# Patient Record
Sex: Female | Born: 1965 | Race: Black or African American | Hispanic: No | Marital: Single | Smoking: Current every day smoker
Health system: Southern US, Community
[De-identification: ages and names within clinical notes are randomized; demographics above are authoritative.]

## PROBLEM LIST (undated history)

## (undated) ENCOUNTER — Emergency Department (HOSPITAL_COMMUNITY): Admission: EM | Payer: Medicaid Other | Source: Home / Self Care

## (undated) DIAGNOSIS — E119 Type 2 diabetes mellitus without complications: Secondary | ICD-10-CM

## (undated) DIAGNOSIS — R479 Unspecified speech disturbances: Secondary | ICD-10-CM

## (undated) DIAGNOSIS — I10 Essential (primary) hypertension: Secondary | ICD-10-CM

## (undated) HISTORY — DX: Essential (primary) hypertension: I10

## (undated) HISTORY — PX: ABDOMINAL HYSTERECTOMY: SHX81

## (undated) HISTORY — PX: MULTIPLE TOOTH EXTRACTIONS: SHX2053

---

## 1997-12-19 ENCOUNTER — Inpatient Hospital Stay (HOSPITAL_COMMUNITY)
Admission: RE | Admit: 1997-12-19 | Discharge: 1998-01-03 | Payer: Self-pay | Admitting: Physical Medicine and Rehabilitation

## 1997-12-23 ENCOUNTER — Encounter: Payer: Self-pay | Admitting: Physical Medicine and Rehabilitation

## 2013-01-30 ENCOUNTER — Ambulatory Visit: Payer: Self-pay | Admitting: Podiatrist

## 2013-02-08 ENCOUNTER — Ambulatory Visit (INDEPENDENT_AMBULATORY_CARE_PROVIDER_SITE_OTHER): Payer: Medicaid Other

## 2013-02-08 ENCOUNTER — Ambulatory Visit: Payer: Self-pay

## 2013-02-08 ENCOUNTER — Encounter (INDEPENDENT_AMBULATORY_CARE_PROVIDER_SITE_OTHER): Payer: Self-pay

## 2013-02-08 VITALS — BP 166/95 | HR 63 | Resp 24 | Ht 62.0 in | Wt 160.0 lb

## 2013-02-08 DIAGNOSIS — L84 Corns and callosities: Secondary | ICD-10-CM

## 2013-02-08 DIAGNOSIS — Q828 Other specified congenital malformations of skin: Secondary | ICD-10-CM

## 2013-02-08 DIAGNOSIS — M79609 Pain in unspecified limb: Secondary | ICD-10-CM

## 2013-02-08 DIAGNOSIS — M2040 Other hammer toe(s) (acquired), unspecified foot: Secondary | ICD-10-CM

## 2013-02-08 DIAGNOSIS — B070 Plantar wart: Secondary | ICD-10-CM

## 2013-02-08 NOTE — Progress Notes (Signed)
  Subjective:    Patient ID: Carla [name], female    DOB: June 04, 1965, 47 y.o.   MRN: [id number] "I got a callus problem."  Carla [name] This is a new problem. The current episode started more than 1 year ago. The problem occurs constantly. The problem has been gradually worsening. Associated symptoms include coughing and headaches. The symptoms are aggravated by walking and standing (shoes, barefoot in house a little). Treatments tried: soak in epsom salt water. The treatment provided mild relief.      Review of Systems  Constitutional: Negative.   HENT: Positive for sinus pressure.   Eyes: Positive for itching.  Respiratory: Positive for cough and shortness of breath.   Cardiovascular: Negative.   Gastrointestinal: Positive for constipation.  Endocrine: Positive for polydipsia.  Genitourinary: Negative.   Musculoskeletal: Positive for Carla [name].  Skin: Negative.   Allergic/Immunologic: Negative.   Neurological: Positive for speech difficulty and headaches.  Hematological:       Slow to heal  Psychiatric/Behavioral: Positive for confusion and decreased concentration.       Objective:   Physical Exam Vascular status is intact with pedal pulses palpable DP and PT + over 4 bilateral. Capillary fill time 3 seconds all digits. Skin temperature warm. Neurologically epicritic and proprioceptive sensations intact and symmetric bilateral. Orthopedic biomechanical exam rectus Carla type noted bilateral adductovarus rotation lesser digits 3 and 45. X-rays reveal rectus Carla type no signs of fracture or osseous abnormalities noted no cyst or tumors bilateral x-rays. Dermatologically skin color pigment normal. Hair growth absent bilateral nails slightly thick and hypertrophic pigmented and incurvated. There is also multiple skin lesions nucleated keratotic lesions sub-first right sub-fifth left extremely painful and tender on debridement and on palpation also keratoses pinch callus in of first left  IPJ and MTP also multiple keratoses inferior heel and arch bilateral. Lesions pretty well nucleated with interruption of skin line consistent with verruca versus porokeratosis. No open wounds or ulcerations are identified at this time    Assessment & Plan:  Assessment this time hammertoe deformities orthopedic exam otherwise unremarkable. No osseous confusion to the keratoses name Carla [name] likely flexible digital contractures verruca plantaris versus porokeratosis keratotic lesions are debrided dispensed literature in instructions for topical salicylic acid application under occlusion utilizing.tape and self-care by patient. Followup in the future on an as-needed basis for additional treatment if recurrence or persistent lesions. Also recommended thick soled shoes cotton or Kerlix socks at all times. Recommended a pumice stone and self palliative care on a daily basis. Next  Alvan Dame DPM

## 2013-02-08 NOTE — Patient Instructions (Signed)
Warts Warts are a common viral infection. They are most commonly caused by the human papillomavirus (HPV). Warts can occur at all ages. However, they occur most frequently in older children and infrequently in the elderly. Warts may be single or multiple. Location and size varies. Warts can be spread by scratching the wart and then scratching normal skin. The life cycle of warts varies. However, most will disappear over many months to a couple years. Warts commonly do not cause problems (asymptomatic) unless they are over an area of pressure, such as the bottom of the foot. If they are large enough, they may cause pain with walking. DIAGNOSIS  Warts are most commonly diagnosed by their appearance. Tissue samples (biopsies) are not required unless the wart looks abnormal. Most warts have a rough surface, are round, oval, or irregular, and are skin-colored to light yellow, brown, or gray. They are generally less than  inch (1.3 cm), but they can be any size. TREATMENT   Observation or no treatment.  Freezing with liquid nitrogen.  High heat (cautery).  Boosting the body's immunity to fight off the wart (immunotherapy using Candida antigen).  Laser surgery.  Application of various irritants and solutions. HOME CARE INSTRUCTIONS  Follow your caregiver's instructions. No special precautions are necessary. Often, treatment may be followed by a return (recurrence) of warts. Warts are generally difficult to treat and get rid of. If treatment is done in a clinic setting, usually more than 1 treatment is required. This is usually done on only a monthly basis until the wart is completely gone. SEEK IMMEDIATE MEDICAL CARE IF: The treated skin becomes red, puffy (swollen), or painful. Document Released: 12/23/2004 Document Revised: 07/10/2012 Document Reviewed: 06/20/2009 [employer] Patient Information 2014 [employer].  

## 2017-05-09 ENCOUNTER — Other Ambulatory Visit: Payer: Self-pay

## 2017-05-09 ENCOUNTER — Emergency Department (HOSPITAL_COMMUNITY): Payer: Medicaid Other

## 2017-05-09 ENCOUNTER — Encounter (HOSPITAL_COMMUNITY): Payer: Self-pay

## 2017-05-09 ENCOUNTER — Inpatient Hospital Stay (HOSPITAL_COMMUNITY)
Admission: EM | Admit: 2017-05-09 | Discharge: 2017-05-11 | DRG: 872 | Disposition: A | Discharge status: Home / Self Care | Payer: Medicaid Other | Attending: Internal Medicine | Admitting: Internal Medicine

## 2017-05-09 DIAGNOSIS — J45901 Unspecified asthma with (acute) exacerbation: Secondary | ICD-10-CM | POA: Diagnosis present

## 2017-05-09 DIAGNOSIS — T380X5A Adverse effect of glucocorticoids and synthetic analogues, initial encounter: Secondary | ICD-10-CM | POA: Diagnosis not present

## 2017-05-09 DIAGNOSIS — I10 Essential (primary) hypertension: Secondary | ICD-10-CM | POA: Diagnosis present

## 2017-05-09 DIAGNOSIS — J441 Chronic obstructive pulmonary disease with (acute) exacerbation: Secondary | ICD-10-CM

## 2017-05-09 DIAGNOSIS — Z79899 Other long term (current) drug therapy: Secondary | ICD-10-CM | POA: Diagnosis not present

## 2017-05-09 DIAGNOSIS — E1165 Type 2 diabetes mellitus with hyperglycemia: Secondary | ICD-10-CM | POA: Diagnosis not present

## 2017-05-09 DIAGNOSIS — F17210 Nicotine dependence, cigarettes, uncomplicated: Secondary | ICD-10-CM | POA: Diagnosis present

## 2017-05-09 DIAGNOSIS — A419 Sepsis, unspecified organism: Secondary | ICD-10-CM

## 2017-05-09 DIAGNOSIS — Z9981 Dependence on supplemental oxygen: Secondary | ICD-10-CM

## 2017-05-09 DIAGNOSIS — J101 Influenza due to other identified influenza virus with other respiratory manifestations: Secondary | ICD-10-CM | POA: Diagnosis present

## 2017-05-09 DIAGNOSIS — E119 Type 2 diabetes mellitus without complications: Secondary | ICD-10-CM

## 2017-05-09 DIAGNOSIS — Z7951 Long term (current) use of inhaled steroids: Secondary | ICD-10-CM

## 2017-05-09 DIAGNOSIS — Z7984 Long term (current) use of oral hypoglycemic drugs: Secondary | ICD-10-CM

## 2017-05-09 HISTORY — DX: Unspecified asthma with (acute) exacerbation: J45.901

## 2017-05-09 HISTORY — DX: Chronic obstructive pulmonary disease with (acute) exacerbation: J44.1

## 2017-05-09 HISTORY — DX: Sepsis, unspecified organism: A41.9

## 2017-05-09 LAB — BASIC METABOLIC PANEL
Anion gap: 14 (ref 5–15)
BUN: 10 mg/dL (ref 6–20)
CHLORIDE: 98 mmol/L — AB (ref 101–111)
CO2: 22 mmol/L (ref 22–32)
Calcium: 9.1 mg/dL (ref 8.9–10.3)
Creatinine, Ser: 0.99 mg/dL (ref 0.44–1.00)
GFR calc Af Amer: >60 mL/min (ref >60)
GFR calc non Af Amer: >60 mL/min (ref >60)
Glucose, Bld: 395 mg/dL — ABNORMAL HIGH (ref 65–99)
POTASSIUM: 4.3 mmol/L (ref 3.5–5.1)
SODIUM: 134 mmol/L — AB (ref 135–145)

## 2017-05-09 LAB — I-STAT CG4 LACTIC ACID, ED
LACTIC ACID, VENOUS: 2.89 mmol/L — AB (ref 0.5–1.9)
LACTIC ACID, VENOUS: 1.14 mmol/L (ref 0.5–1.9)

## 2017-05-09 LAB — CBC
HEMATOCRIT: 39.5 % (ref 36.0–46.0)
HEMOGLOBIN: 13 g/dL (ref 12.0–15.0)
MCH: 32.3 pg (ref 26.0–34.0)
MCHC: 32.9 g/dL (ref 30.0–36.0)
MCV: 98 fL (ref 78.0–100.0)
Platelets: 164 10*3/uL (ref 150–400)
RBC: 4.03 MIL/uL (ref 3.87–5.11)
RDW: 13.4 % (ref 11.5–15.5)
WBC: 9 10*3/uL (ref 4.0–10.5)

## 2017-05-09 LAB — INFLUENZA PANEL BY PCR (TYPE A & B)
INFLAPCR: POSITIVE — AB
INFLBPCR: NEGATIVE

## 2017-05-09 LAB — GLUCOSE, CAPILLARY: GLUCOSE-CAPILLARY: 265 mg/dL — AB (ref 65–99)

## 2017-05-09 LAB — PROCALCITONIN: PROCALCITONIN: 0.18 ng/mL

## 2017-05-09 LAB — I-STAT TROPONIN, ED
Troponin i, poc: 0 ng/mL (ref 0.00–0.08)
Troponin i, poc: 0 ng/mL (ref 0.00–0.08)

## 2017-05-09 LAB — CBG MONITORING, ED: Glucose-Capillary: 191 mg/dL — ABNORMAL HIGH (ref 65–99)

## 2017-05-09 IMAGING — DX DG CHEST 2V
2 series · 2 of 2 positions shown · non-contrast
Comparison: CT chest [DATE]

CLINICAL DATA: Chest pain started yesterday.  Cough.

EXAM:
CHEST  2 VIEW

[w chest pa]
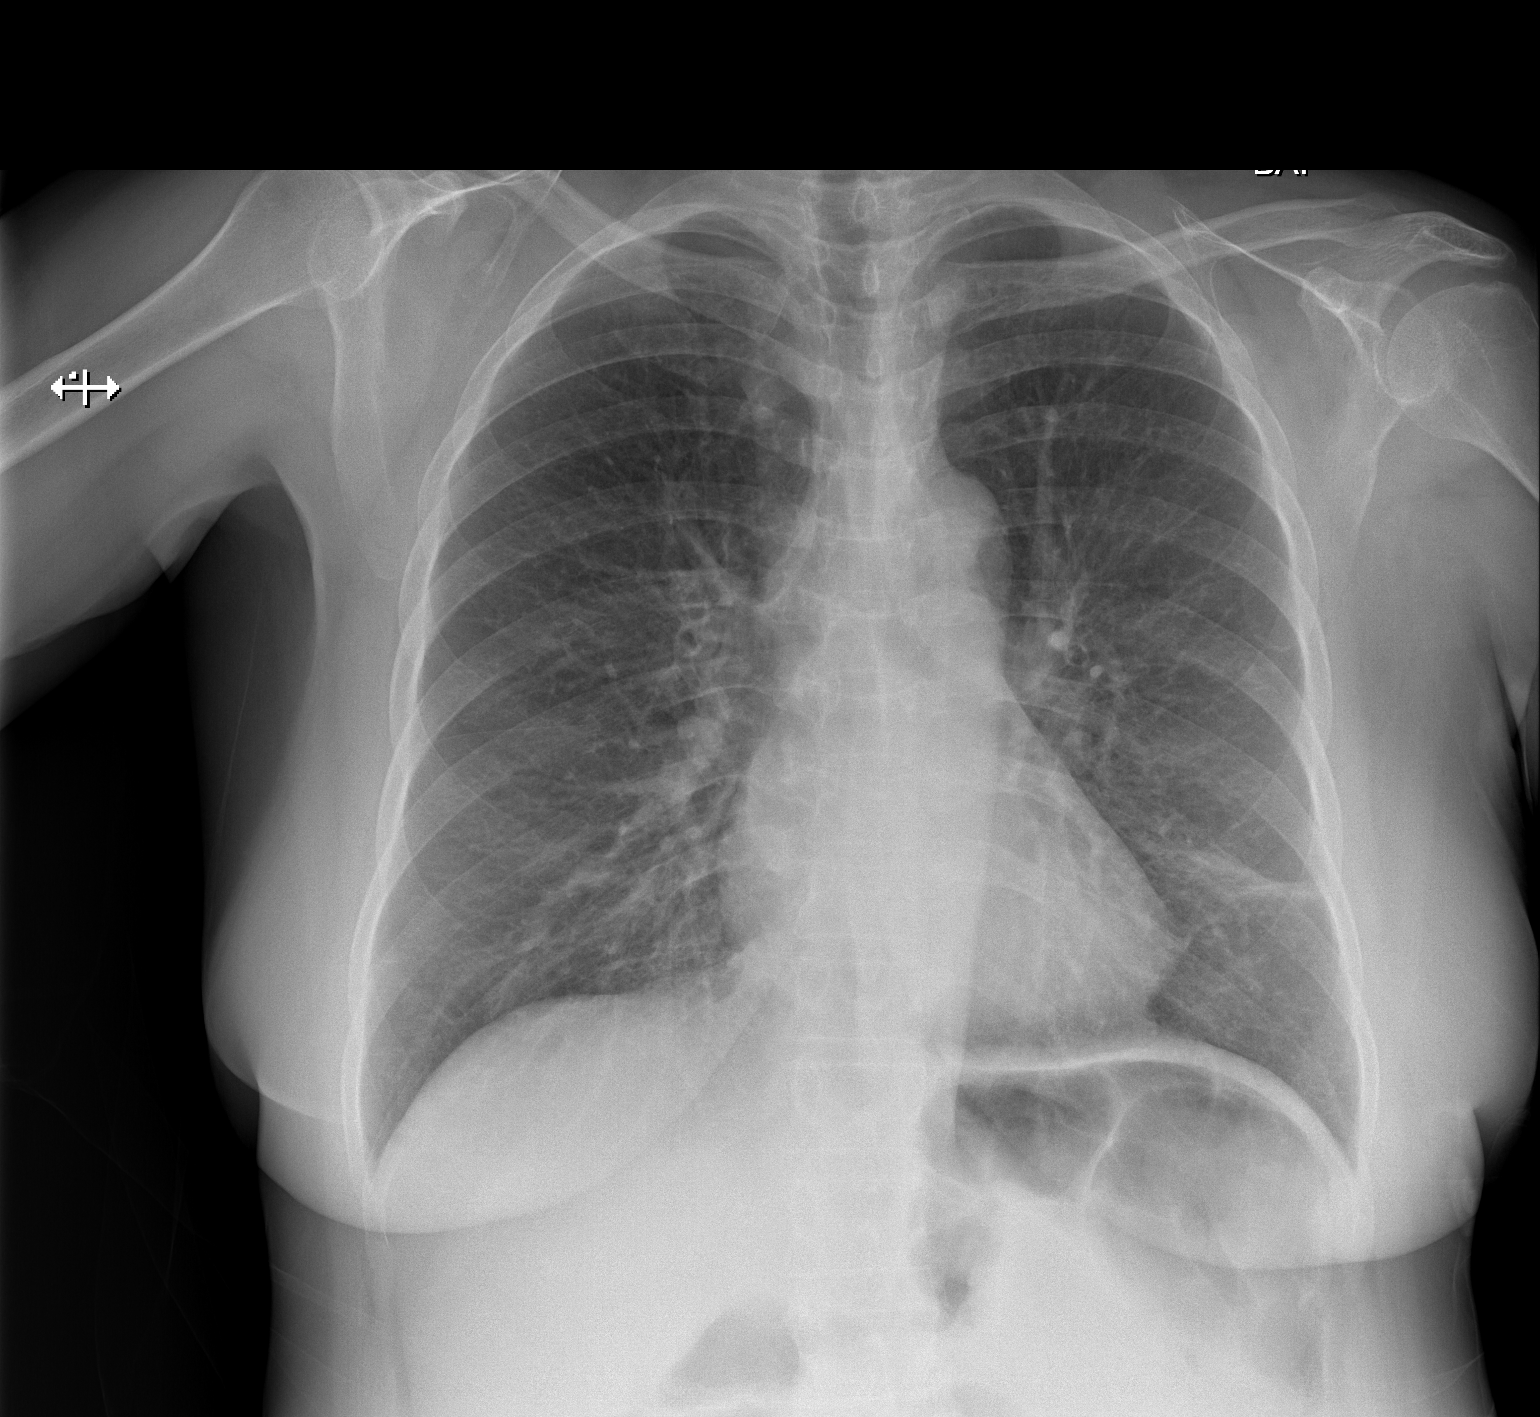

[w chest lat]
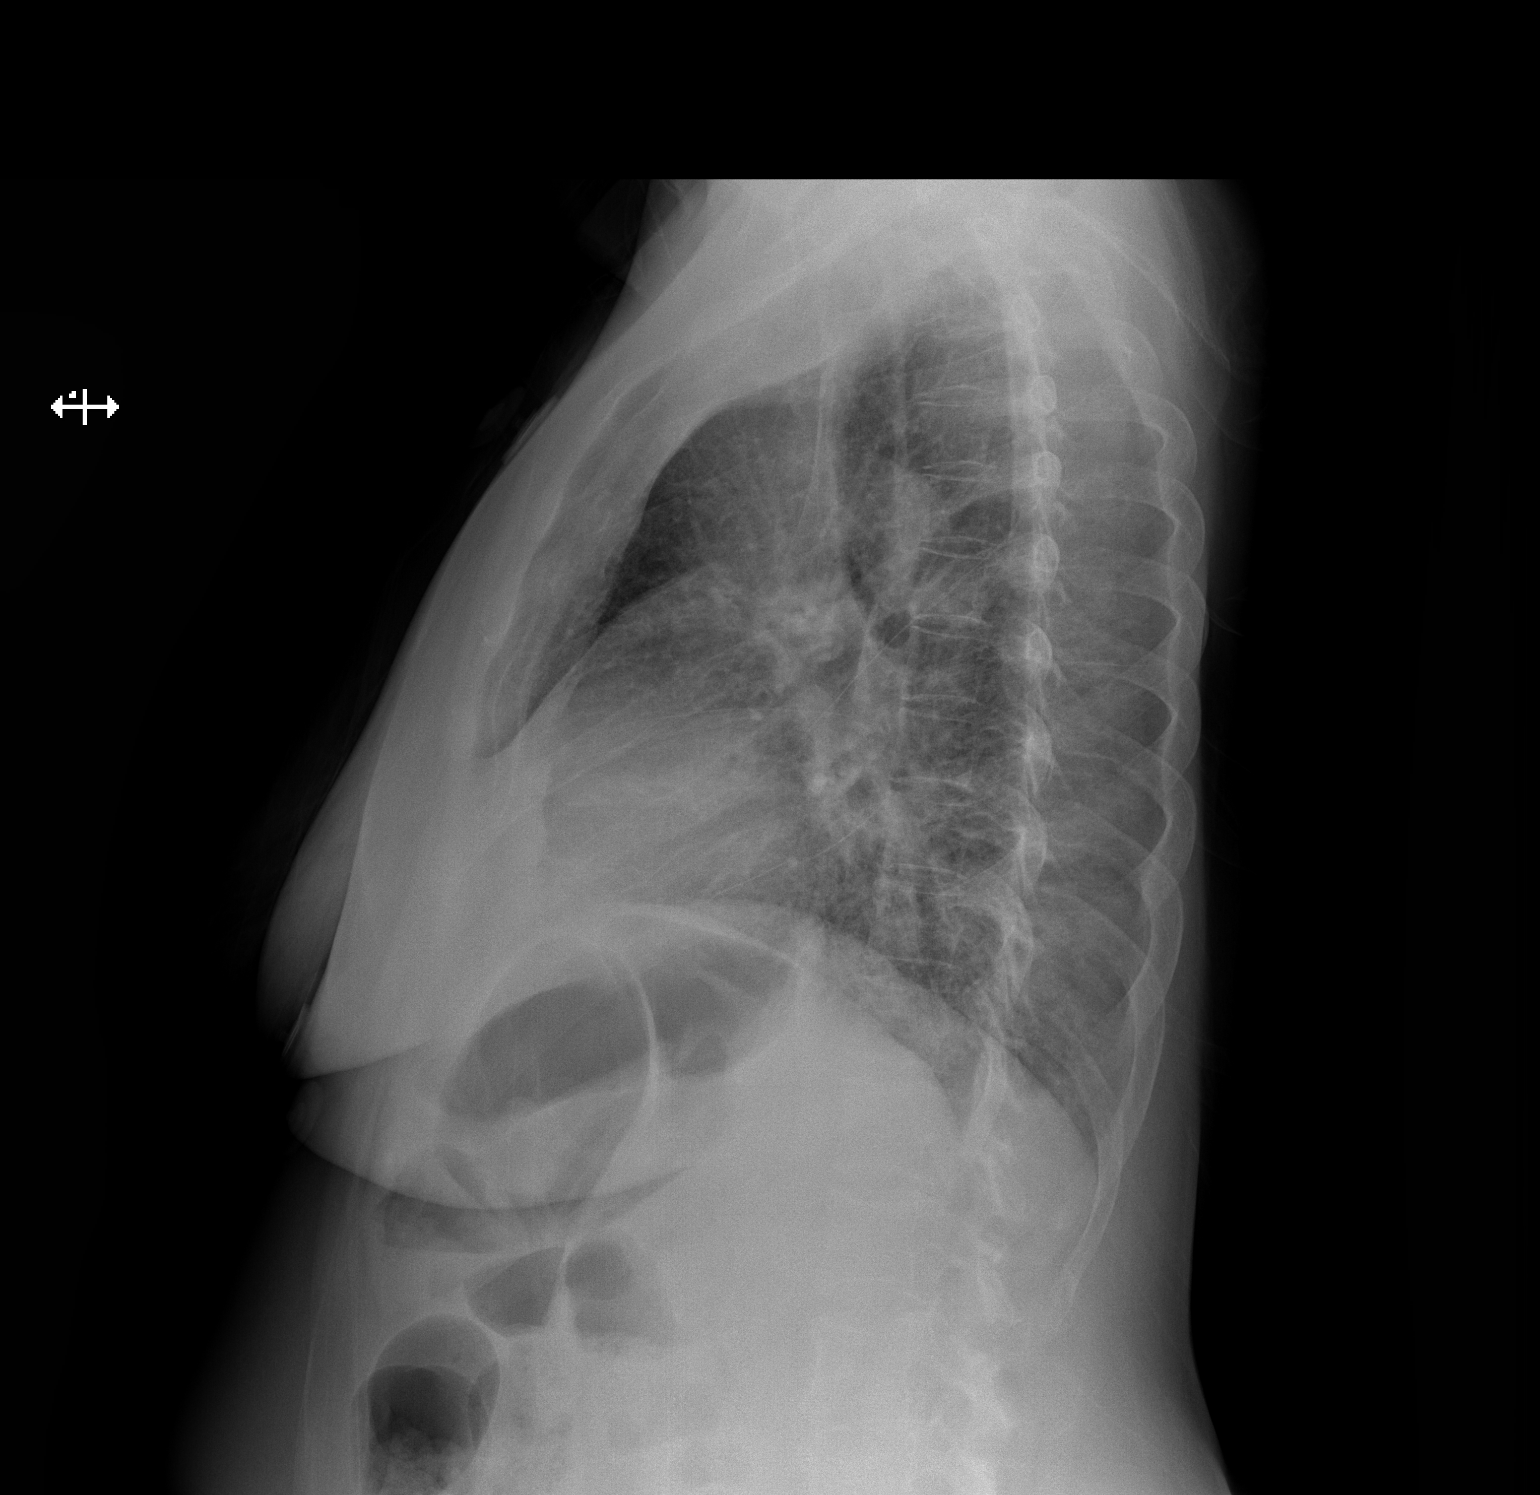

[2 of 2 positions shown; findings below may reference images not displayed]

FINDINGS: There is mild lingular atelectasis. There is no focal consolidation.
There is no pleural effusion or pneumothorax. The heart and
mediastinal contours are unremarkable.

The osseous structures are unremarkable.
IMPRESSION: No active cardiopulmonary disease.

## 2017-05-09 MED ORDER — SODIUM CHLORIDE 0.9 % IV SOLN
INTRAVENOUS | Status: AC
  Administered 2017-05-09: 23:00:00 via INTRAVENOUS

## 2017-05-09 MED ORDER — IBUPROFEN 400 MG PO TABS
600.0000 mg | ORAL_TABLET | Freq: Once | ORAL | Status: AC
  Administered 2017-05-09: 600 mg via ORAL
  Filled 2017-05-09: qty 1

## 2017-05-09 MED ORDER — PAROXETINE HCL 10 MG PO TABS
10.0000 mg | ORAL_TABLET | Freq: Every day | ORAL | Status: DC
  Administered 2017-05-10 – 2017-05-11 (×2): 10 mg via ORAL
  Filled 2017-05-09 (×2): qty 1

## 2017-05-09 MED ORDER — FAMOTIDINE 10 MG PO TABS
10.0000 mg | ORAL_TABLET | Freq: Two times a day (BID) | ORAL | Status: DC
  Administered 2017-05-10 – 2017-05-11 (×3): 10 mg via ORAL
  Filled 2017-05-09 (×3): qty 1

## 2017-05-09 MED ORDER — ACETAMINOPHEN 650 MG RE SUPP: 650.0000 mg | Freq: Four times a day (QID) | RECTAL | Status: DC | PRN

## 2017-05-09 MED ORDER — IPRATROPIUM-ALBUTEROL 0.5-2.5 (3) MG/3ML IN SOLN
3.0000 mL | Freq: Once | RESPIRATORY_TRACT | Status: AC
  Administered 2017-05-09: 3 mL via RESPIRATORY_TRACT
  Filled 2017-05-09: qty 3

## 2017-05-09 MED ORDER — HYDROCODONE-ACETAMINOPHEN 5-325 MG PO TABS
1.0000 | ORAL_TABLET | ORAL | Status: DC | PRN
  Administered 2017-05-10: 2 via ORAL
  Administered 2017-05-11: 1 via ORAL
  Filled 2017-05-09: qty 2
  Filled 2017-05-09: qty 1

## 2017-05-09 MED ORDER — NICOTINE 14 MG/24HR TD PT24
14.0000 mg | MEDICATED_PATCH | Freq: Every day | TRANSDERMAL | Status: DC
  Administered 2017-05-10 – 2017-05-11 (×2): 14 mg via TRANSDERMAL
  Filled 2017-05-09 (×2): qty 1

## 2017-05-09 MED ORDER — INSULIN ASPART 100 UNIT/ML ~~LOC~~ SOLN
0.0000 [IU] | Freq: Three times a day (TID) | SUBCUTANEOUS | Status: DC
  Administered 2017-05-10: 8 [IU] via SUBCUTANEOUS
  Administered 2017-05-10: 11 [IU] via SUBCUTANEOUS
  Administered 2017-05-10: 8 [IU] via SUBCUTANEOUS
  Administered 2017-05-11: 15 [IU] via SUBCUTANEOUS
  Administered 2017-05-11: 5 [IU] via SUBCUTANEOUS

## 2017-05-09 MED ORDER — ENOXAPARIN SODIUM 40 MG/0.4ML ~~LOC~~ SOLN
40.0000 mg | SUBCUTANEOUS | Status: DC
  Administered 2017-05-10: 40 mg via SUBCUTANEOUS
  Filled 2017-05-09 (×2): qty 0.4

## 2017-05-09 MED ORDER — ALBUTEROL SULFATE (2.5 MG/3ML) 0.083% IN NEBU: 2.5000 mg | INHALATION_SOLUTION | RESPIRATORY_TRACT | Status: DC | PRN

## 2017-05-09 MED ORDER — IPRATROPIUM-ALBUTEROL 0.5-2.5 (3) MG/3ML IN SOLN
3.0000 mL | Freq: Four times a day (QID) | RESPIRATORY_TRACT | Status: DC
  Administered 2017-05-10: 3 mL via RESPIRATORY_TRACT
  Filled 2017-05-09: qty 3

## 2017-05-09 MED ORDER — SODIUM CHLORIDE 0.9 % IV SOLN: 1.0000 g | INTRAVENOUS | Status: DC

## 2017-05-09 MED ORDER — ONDANSETRON HCL 4 MG/2ML IJ SOLN: 4.0000 mg | Freq: Four times a day (QID) | INTRAMUSCULAR | Status: DC | PRN

## 2017-05-09 MED ORDER — SODIUM CHLORIDE 0.9 % IV SOLN: 500.0000 mg | INTRAVENOUS | Status: DC

## 2017-05-09 MED ORDER — ACETAMINOPHEN 325 MG PO TABS: 650.0000 mg | ORAL_TABLET | Freq: Four times a day (QID) | ORAL | Status: DC | PRN

## 2017-05-09 MED ORDER — INSULIN ASPART 100 UNIT/ML ~~LOC~~ SOLN
0.0000 [IU] | Freq: Every day | SUBCUTANEOUS | Status: DC
  Administered 2017-05-09: 3 [IU] via SUBCUTANEOUS
  Administered 2017-05-10: 4 [IU] via SUBCUTANEOUS

## 2017-05-09 MED ORDER — ACETAMINOPHEN 325 MG PO TABS
650.0000 mg | ORAL_TABLET | Freq: Once | ORAL | Status: AC
  Administered 2017-05-09: 650 mg via ORAL
  Filled 2017-05-09: qty 2

## 2017-05-09 MED ORDER — PREDNISONE 20 MG PO TABS: 40.0000 mg | ORAL_TABLET | Freq: Every day | ORAL | Status: DC

## 2017-05-09 MED ORDER — METHYLPREDNISOLONE SODIUM SUCC 125 MG IJ SOLR
60.0000 mg | Freq: Two times a day (BID) | INTRAMUSCULAR | Status: DC
  Administered 2017-05-09: 60 mg via INTRAVENOUS
  Filled 2017-05-09: qty 2

## 2017-05-09 MED ORDER — ONDANSETRON HCL 4 MG PO TABS: 4.0000 mg | ORAL_TABLET | Freq: Four times a day (QID) | ORAL | Status: DC | PRN

## 2017-05-09 MED ORDER — SODIUM CHLORIDE 0.9 % IV BOLUS (SEPSIS)
2000.0000 mL | Freq: Once | INTRAVENOUS | Status: AC
  Administered 2017-05-09: 2000 mL via INTRAVENOUS

## 2017-05-09 MED ORDER — PANTOPRAZOLE SODIUM 40 MG PO TBEC
40.0000 mg | DELAYED_RELEASE_TABLET | Freq: Every day | ORAL | Status: DC
  Administered 2017-05-10 – 2017-05-11 (×2): 40 mg via ORAL
  Filled 2017-05-09 (×2): qty 1

## 2017-05-09 MED ORDER — CEFTRIAXONE SODIUM 1 G IJ SOLR
1.0000 g | Freq: Once | INTRAMUSCULAR | Status: AC
  Administered 2017-05-09: 1 g via INTRAVENOUS
  Filled 2017-05-09: qty 10

## 2017-05-09 MED ORDER — METHYLPREDNISOLONE SODIUM SUCC 125 MG IJ SOLR
125.0000 mg | Freq: Once | INTRAMUSCULAR | Status: AC
  Administered 2017-05-09: 125 mg via INTRAVENOUS
  Filled 2017-05-09: qty 2

## 2017-05-09 MED ORDER — GUAIFENESIN ER 600 MG PO TB12
600.0000 mg | ORAL_TABLET | Freq: Two times a day (BID) | ORAL | Status: DC
  Administered 2017-05-09 – 2017-05-11 (×4): 600 mg via ORAL
  Filled 2017-05-09 (×4): qty 1

## 2017-05-09 MED ORDER — SODIUM CHLORIDE 0.9 % IV SOLN
500.0000 mg | Freq: Once | INTRAVENOUS | Status: AC
  Administered 2017-05-10: 500 mg via INTRAVENOUS
  Filled 2017-05-09: qty 500

## 2017-05-09 NOTE — ED Notes (Signed)
Ambulated pt in hallway while checking O2, tolerated well. Pt's O2 started at 96% while ambulating it dropped to 91%. Nurse was notified.

## 2017-05-09 NOTE — H&P (Signed)
Carla [name] EXB:[id number] DOB: 04/27/1965 DOA: 05/09/2017     NUU:[facility] Outpatient Specialists:none Patient coming from:    home Lives alone,      Chief Complaint: shortness of breath  HPI: Carla [name] is a 52 y.o. female with medical history significant of asthma, DM 2, HTN, tobacco abuse    Presented with 3 days of shortness of breath, overall feeling poorly had having some headaches and coughing productive of greenish sputum for the past 3 days no hemoptysis but she have had increased wheezing and shortness of breath Developed chest pain today mid sternum while she was cleaning her house initially pain was severe 9.5 out of 10 only happens with coughing does not change with inspiration physician or exertion nonradiating. She has known history of asthma for which she's been using her inhalers  denies fever, denies sick contacts.   While in emergency department oxygen saturation went down to 91% while ambulating with increased work of breathing. Noted to be Febbrile up to 102.7 Initial lactic acid 2.8 9 repeat was 1.1 for initial troponin unremarkable 0.00 Sodium 134 glucose 395 WBC 9.0 Chest x-ray was unremarkable  Regarding pertinent Chronic problems: History of diabetes on metformin and glipizide, history of hypertension on amlodipine   IN ER:  Temp (24hrs), Avg:100.5 F (38.1 C), Min:98.8 F (37.1 C), Max:102.7 F (39.3 C)      on arrival  ED Triage Vitals  Enc Vitals Group     BP 05/09/17 1317 123/73     Pulse Rate 05/09/17 1317 100     Resp 05/09/17 1317 20     Temp 05/09/17 1317 (!) 102.7 F (39.3 C)     Temp Source 05/09/17 1317 Oral     SpO2 05/09/17 1317 96 %     Weight --      Height --      Head Circumference --      Peak Flow --      Pain Score 05/09/17 1322 9     Pain Loc --      Pain Edu? --      Excl. in GC? --     Latest RR 19 satting 98% HR 84 BP 119/95  Following Medications were ordered in ER: Medications  acetaminophen  (TYLENOL) tablet 650 mg (650 mg Oral Given 05/09/17 1329)  sodium chloride 0.9 % bolus 2,000 mL (0 mLs Intravenous Stopped 05/09/17 1757)  ibuprofen (ADVIL,MOTRIN) tablet 600 mg (600 mg Oral Given 05/09/17 1709)  ipratropium-albuterol (DUONEB) 0.5-2.5 (3) MG/3ML nebulizer solution 3 mL (3 mLs Nebulization Given 05/09/17 1709)  methylPREDNISolone sodium succinate (SOLU-MEDROL) 125 mg/2 mL injection 125 mg (125 mg Intravenous Given 05/09/17 1708)  ipratropium-albuterol (DUONEB) 0.5-2.5 (3) MG/3ML nebulizer solution 3 mL (3 mLs Nebulization Given 05/09/17 1802)    Hospitalist was called for admission for likely asthma exacerbation in a setting of URI or viral illness  Review of Systems:    Pertinent positives include:  Fevers, chills, fatigue,  dyspnea on exertion  Constitutional:  No weight loss, night sweats,  weight loss  HEENT:  No headaches, Difficulty swallowing,Tooth/dental problems,Sore throat,  No sneezing, itching, ear ache, nasal congestion, post nasal drip,  Cardio-vascular:  No chest pain, Orthopnea, PND, anasarca, dizziness, palpitations.no Bilateral lower extremity swelling  GI:  No heartburn, indigestion, abdominal pain, nausea, vomiting, diarrhea, change in bowel habits, loss of appetite, melena, blood in stool, hematemesis Resp:  no shortness of breath at rest. No , No excess mucus, no productive cough, No non-productive  cough, No coughing up of blood.No change in color of mucus.No wheezing. Skin:  no rash or lesions. No jaundice GU:  no dysuria, change in color of urine, no urgency or frequency. No straining to urinate.  No flank pain.  Musculoskeletal:  No joint pain or no joint swelling. No decreased range of motion. No back pain.  Psych:  No change in mood or affect. No depression or anxiety. No memory loss.  Neuro: no localizing neurological complaints, no tingling, no weakness, no double vision, no gait abnormality, no slurred speech, no confusion  As per HPI  otherwise 10 point review of systems negative.   Past Medical History: Past Medical History:  Diagnosis Date  . Hypertension    Past Surgical History:  Procedure Laterality Date  . ABDOMINAL HYSTERECTOMY    . CESAREAN SECTION    . MULTIPLE TOOTH EXTRACTIONS Bilateral      Social History:  Ambulatory independently      reports that she has been smoking cigarettes.  She has been smoking about 0.50 packs per day. She does not have any smokeless tobacco history on file. She reports that she drinks alcohol. She reports that she does not use drugs.  Allergies:  No Known Allergies     Family History:   Family History  Problem Relation Age of Onset  . Kidney disease Mother   . Diabetes Mother   . Hypertension Mother   . Stroke Father     Medications: Prior to Admission medications   Medication Sig Start Date End Date Taking? Authorizing Provider  albuterol (PROVENTIL HFA;VENTOLIN HFA) 108 (90 Base) MCG/ACT inhaler Inhale 2 puffs into the lungs every 6 (six) hours as needed for wheezing.   Yes [provider]  amLODipine (NORVASC) 10 MG tablet Take 10 mg by mouth daily. 02/16/17  Yes [provider]  glipiZIDE (GLUCOTROL) 5 MG tablet Take 5 mg by mouth 2 (two) times daily. 05/20/16  Yes [provider]  ibuprofen (ADVIL,MOTRIN) 200 MG tablet Take 400 mg by mouth every 6 (six) hours as needed for mild pain.   Yes [provider]  lisinopril (PRINIVIL,ZESTRIL) 20 MG tablet Take 20 mg by mouth daily. 08/21/14  Yes [provider]  metFORMIN (GLUCOPHAGE) 850 MG tablet Take 850 mg by mouth 2 (two) times daily. 08/21/14  Yes [provider]  omeprazole (PRILOSEC) 40 MG capsule Take 40 mg by mouth daily. 05/17/16  Yes [provider]  PARoxetine (PAXIL) 10 MG tablet Take 10 mg by mouth daily. 08/21/14  Yes [provider]  ranitidine (ZANTAC) 300 MG capsule Take 300 mg by mouth every evening. 08/20/16  Yes [provider]    Physical Exam: Patient Vitals for the past 24 hrs:  BP Temp Temp src Pulse Resp SpO2  05/09/17 1755 - 98.8 F (37.1 C) Oral - - -  05/09/17 1730 (!) 119/95 - - 84 19 98 %  05/09/17 1700 112/86 - - 80 16 95 %  05/09/17 1515 94/70 - - 94 (!) 29 94 %  05/09/17 1513 - 100.1 F (37.8 C) Oral - - -  05/09/17 1501 93/66 - - 95 (!) 25 98 %  05/09/17 1325 - - - - (!) 34 -  05/09/17 1317 123/73 (!) 102.7 F (39.3 C) Oral 100 20 96 %    1. General:  in No Acute distress  well -appearing 2. Psychological: Alert and   Oriented 3. Head/ENT:   Moist   Mucous Membranes  Head Non traumatic, neck supple                            Poor Dentition 4. SKIN: normal  kin turgor,  Skin clean Dry and intact no rash 5. Heart: Regular rate and rhythm no Murmur, no Rub or gallop 6. Lungs:  no wheezes but diminished 7. Abdomen: Soft, non-tender, Non distended   8. Lower extremities: no clubbing, cyanosis, or edema 9. Neurologically Grossly intact, moving all 4 extremities equally   10. MSK: Normal range of motion   body mass index is unknown because there is no height or weight on file.  Labs on Admission:   Labs on Admission: I have personally reviewed following labs and imaging studies  CBC: Recent Labs  Lab 05/09/17 1324  WBC 9.0  HGB 13.0  HCT 39.5  MCV 98.0  PLT 164   Basic Metabolic Panel: Recent Labs  Lab 05/09/17 1324  NA 134*  K 4.3  CL 98*  CO2 22  GLUCOSE 395*  BUN 10  CREATININE 0.99  CALCIUM 9.1   GFR: CrCl cannot be calculated (Unknown ideal weight.). Liver Function Tests: No results for input(s): AST, ALT, ALKPHOS, BILITOT, PROT, ALBUMIN in the last 168 hours. No results for input(s): LIPASE, AMYLASE in the last 168 hours. No results for input(s): AMMONIA in the last 168 hours. Coagulation Profile: No results for input(s): INR, PROTIME in the last 168 hours. Cardiac Enzymes: No results for input(s): CKTOTAL, CKMB,  CKMBINDEX, TROPONINI in the last 168 hours. BNP (last 3 results) No results for input(s): PROBNP in the last 8760 hours. HbA1C: No results for input(s): HGBA1C in the last 72 hours. CBG: Recent Labs  Lab 05/09/17 1720  GLUCAP 191*   Lipid Profile: No results for input(s): CHOL, HDL, LDLCALC, TRIG, CHOLHDL, LDLDIRECT in the last 72 hours. Thyroid Function Tests: No results for input(s): TSH, T4TOTAL, FREET4, T3FREE, THYROIDAB in the last 72 hours. Anemia Panel: No results for input(s): VITAMINB12, FOLATE, FERRITIN, TIBC, IRON, RETICCTPCT in the last 72 hours. Urine analysis: No results found for: COLORURINE, APPEARANCEUR, LABSPEC, PHURINE, GLUCOSEU, HGBUR, BILIRUBINUR, KETONESUR, PROTEINUR, UROBILINOGEN, NITRITE, LEUKOCYTESUR Sepsis Labs: @LABRCNTIP (procalcitonin:4,lacticidven:4) )No results found for this or any previous visit (from the past 240 hour(s)).     UA not ordered  No results found for: HGBA1C  CrCl cannot be calculated (Unknown ideal weight.).  BNP (last 3 results) No results for input(s): PROBNP in the last 8760 hours.   ECG REPORT  Independently reviewed Rate: 101  Rhythm: Sinus tachycardia ST&T Change: No acute ischemic changes   QTC 614  There were no vitals filed for this visit.   Cultures: No results found for: SDES, SPECREQUEST, CULT, REPTSTATUS   Radiological Exams on Admission: Dg Chest 2 View  Result Date: 05/09/2017 CLINICAL DATA:  Chest pain started yesterday.  Cough. EXAM: CHEST  2 VIEW COMPARISON:  CT chest 10/26/2016 FINDINGS: There is mild lingular atelectasis. There is no focal consolidation. There is no pleural effusion or pneumothorax. The heart and mediastinal contours are unremarkable. The osseous structures are unremarkable. IMPRESSION: No active cardiopulmonary disease. Electronically Signed   By: Elige Ko   On: 05/09/2017 14:39    Chart has been reviewed    Assessment/Plan  52 y.o. female with medical history significant  of asthma, DM 2, HTN, tobacco abuse   Admitted for  likely asthma exacerbation in a setting of URI or viral illness   Present on  Admission:  . Sepsis (HCC) - most likely secondary to viral illness for now cover for possible respiratory source await results of influenza PCR and respiratory panel blood and sputum cultures ordered, continue with IV fluid rehydration and IV antibiotics  . Asthma, chronic, unspecified asthma severity, with acute exacerbation -  -  - Will initiate: Steroid taper  -  Antibiotics ceftriaxone and azithromycin - Albuterol  PRN, - scheduled duoneb,  -  Breo or Dulera at discharge   -  Mucinex.  Titrate O2 to saturation >90%. Follow patients respiratory status.  Order respiratory panel and influenza PCR  Ordered flutter valve   Tobacco abuse  - order nicotine patch DM 2 -  - Order  moderate SSI    -  check TSH and HgA1C  - Hold by mouth medications      Currently mentating well no evidence of symptomatic hypercarbia      Other plan as per orders.  DVT prophylaxis:    Lovenox     Code Status:  FULL CODE as per patient    Family Communication:   Family not  at  Bedside    Disposition Plan:      To home once workup is complete and patient is stable                                                  Consults called: 52 y.o. female with medical history significant of asthma, DM 2, HTN, tobacco abuse   Admission status:    inpatient      Level of care   medical floor       I have spent a total of 56 min on this admission   Ardena Gangl 05/09/2017, 8:52 PM    [facility]  Pager [phone]   after 2 AM please page floor coverage PA If 7AM-7PM, please contact the day team taking care of the patient  [email or web address]  Password [email or web address]

## 2017-05-09 NOTE — ED Triage Notes (Signed)
Patient complains of 1 day of SSCP with headache. patient congested and states that is related to her asthma. States that her breathing is normal for her, denies cough

## 2017-05-09 NOTE — ED Provider Notes (Signed)
[facility] PROGRESSIVE CARE Provider Note   CSN: [id number] Arrival date & time: 05/09/17  1300     History   Chief Complaint Chief Complaint  Patient presents with  . Chest Pain  . Headache    HPI Carla [name] is a 52 y.o. female.  HPI   52 y/o F with a h/o HTN, DMT2, and asthma, who presents to the ED c/o midsternal chest pain that began yesterday while she was cleaning her house. Rates pain at a 9.5/10. Pain is intermittent and is present only when she coughs. Not worse with inspiration. Pain is nonradiating. It feels like an aching pain and "like heartburn". She has not tried taking anything for her symptoms. She also reports a productive cough with green sputum for the last 3 days. Denies hemoptysis. Pt also reports rhinorrhea and shortness of breath with exertion that has been chronic for years and remains unchanged from previous.  States she had a frontal headadche when she arrived to the ED but this has resolved.  Denies fevers, chills, nasal congestion, sore throat, dizziness, lightheadedness, NVD, or urinary sxs.  Reports constipation x1 week. She is still passing gas. Has taken miralax with no relief. Denies abd pain.  Uses tobacco. Denies etoh use. Denies any drug use. Has a h/o substance abuse cocaine, but has not used in 1 year. Denies leg pain/swelling, hemoptysis, recent surgery/trauma, recent long travel, hormone use, personal hx of cancer, or hx of DVT/PE.  Has a h/o asthma and has been using her inhalers normally.   Past Medical History:  Diagnosis Date  . Hypertension     Patient Active Problem List   Diagnosis Date Noted  . COPD with acute exacerbation (HCC) 05/09/2017  . Sepsis (HCC) 05/09/2017  . Asthma, chronic, unspecified asthma severity, with acute exacerbation 05/09/2017  . Asthma exacerbation 05/09/2017    Past Surgical History:  Procedure Laterality Date  . ABDOMINAL HYSTERECTOMY    . CESAREAN SECTION    . MULTIPLE TOOTH EXTRACTIONS  Bilateral     OB History    No data available       Home Medications    Prior to Admission medications   Medication Sig Start Date End Date Taking? Authorizing Provider  albuterol (PROVENTIL HFA;VENTOLIN HFA) 108 (90 Base) MCG/ACT inhaler Inhale 2 puffs into the lungs every 6 (six) hours as needed for wheezing.   Yes [provider]  amLODipine (NORVASC) 10 MG tablet Take 10 mg by mouth daily. 02/16/17  Yes [provider]  glipiZIDE (GLUCOTROL) 5 MG tablet Take 5 mg by mouth 2 (two) times daily. 05/20/16  Yes [provider]  ibuprofen (ADVIL,MOTRIN) 200 MG tablet Take 400 mg by mouth every 6 (six) hours as needed for mild pain.   Yes [provider]  lisinopril (PRINIVIL,ZESTRIL) 20 MG tablet Take 20 mg by mouth daily. 08/21/14  Yes [provider]  metFORMIN (GLUCOPHAGE) 850 MG tablet Take 850 mg by mouth 2 (two) times daily. 08/21/14  Yes [provider]  omeprazole (PRILOSEC) 40 MG capsule Take 40 mg by mouth daily. 05/17/16  Yes [provider]  PARoxetine (PAXIL) 10 MG tablet Take 10 mg by mouth daily. 08/21/14  Yes [provider]  ranitidine (ZANTAC) 300 MG capsule Take 300 mg by mouth every evening. 08/20/16  Yes [provider]    Family History Family History  Problem Relation Age of Onset  . Kidney disease Mother   . Diabetes Mother   . Hypertension  Mother   . Stroke Father     Social History Social History   Tobacco Use  . Smoking status: Current Every Day Smoker    Packs/day: 0.50    Types: Cigarettes  . Smokeless tobacco: Never Used  Substance Use Topics  . Alcohol use: Yes    Comment: only when I'm hurting or stressed out.  . Drug use: No     Allergies   Patient has no known allergies.   Review of Systems Review of Systems  HENT: Positive for rhinorrhea. Negative for congestion, ear pain, postnasal drip, sinus pressure, sinus pain, sore throat and trouble swallowing.     Eyes: Negative for visual disturbance.  Respiratory: Positive for cough and shortness of breath. Negative for wheezing.   Cardiovascular: Positive for chest pain. Negative for palpitations and leg swelling.  Gastrointestinal: Positive for constipation. Negative for blood in stool, diarrhea, nausea and vomiting.  Genitourinary: Negative for dysuria, frequency, hematuria and urgency.  Musculoskeletal: Negative for arthralgias, back pain, neck pain and neck stiffness.  Neurological: Positive for headaches (resolved). Negative for dizziness, weakness, light-headedness and numbness.     Physical Exam Updated Vital Signs BP (!) 137/92 (BP Location: Left Arm)   Pulse 73   Temp 98.6 F (37 C) (Oral)   Resp 18   Ht 5\' 2"  (1.575 m)   Wt 72.4 kg (159 lb 9.8 oz)   SpO2 95%   BMI 29.19 kg/m   Physical Exam  Constitutional: She is oriented to person, place, and time. She appears well-developed and well-nourished. No distress.  HENT:  Head: Normocephalic and atraumatic.  Eyes: Conjunctivae and EOM are normal. Pupils are equal, round, and reactive to light.  Neck: Normal range of motion. Neck supple. No JVD present.  Cardiovascular: Normal rate, regular rhythm, intact distal pulses and normal pulses. Exam reveals no friction rub.  No murmur heard. Pulmonary/Chest: Effort normal and breath sounds normal. Stridor present. No respiratory distress. She has no wheezes. She has no rhonchi. She has no rales.  Wet cough on exam, upper airway sounds heard, pt speaking in full sentences. No chest tenderness  Abdominal: Soft. Bowel sounds are normal. She exhibits no distension and no mass. There is no tenderness. There is no rebound and no guarding.  Musculoskeletal: Normal range of motion. She exhibits no edema.       Right lower leg: Normal. She exhibits no tenderness and no edema.       Left lower leg: Normal. She exhibits no tenderness and no edema.  Lymphadenopathy:    She has no cervical  adenopathy.  Neurological: She is alert and oriented to person, place, and time.  Skin: Skin is warm and dry. Capillary refill takes less than 2 seconds. No erythema.  Psychiatric: She has a normal mood and affect.  Nursing note and vitals reviewed.    ED Treatments / Results  Labs (all labs ordered are listed, but only abnormal results are displayed) Labs Reviewed  BASIC METABOLIC PANEL - Abnormal; Notable for the following components:      Result Value   Sodium 134 (*)    Chloride 98 (*)    Glucose, Bld 395 (*)    All other components within normal limits  INFLUENZA PANEL BY PCR (TYPE A & B) - Abnormal; Notable for the following components:   Influenza A By PCR POSITIVE (*)    All other components within normal limits  GLUCOSE, CAPILLARY - Abnormal; Notable for the following components:   Glucose-Capillary 265 (*)  All other components within normal limits  I-STAT CG4 LACTIC ACID, ED - Abnormal; Notable for the following components:   Lactic Acid, Venous 2.89 (*)    All other components within normal limits  CBG MONITORING, ED - Abnormal; Notable for the following components:   Glucose-Capillary 191 (*)    All other components within normal limits  CULTURE, BLOOD (ROUTINE X 2)  CULTURE, BLOOD (ROUTINE X 2)  RESPIRATORY PANEL BY PCR  CBC  PROCALCITONIN  HIV ANTIBODY (ROUTINE TESTING)  MAGNESIUM  PHOSPHORUS  TSH  COMPREHENSIVE METABOLIC PANEL  CBC  HEMOGLOBIN A1C  I-STAT TROPONIN, ED  I-STAT CG4 LACTIC ACID, ED  I-STAT TROPONIN, ED    EKG  EKG Interpretation  Date/Time:  Monday May 09 2017 13:28:45 EST Ventricular Rate:  99 PR Interval:  122 QRS Duration: 78 QT Interval:  332 QTC Calculation: 426 R Axis:   98 Text Interpretation:  Normal sinus rhythm Rightward axis Nonspecific ST abnormality Abnormal ECG Confirmed by Margarita Grizzle [phone]) on 05/09/2017 5:13:54 PM       Radiology Dg Chest 2 View  Result Date: 05/09/2017 CLINICAL DATA:  Chest pain  started yesterday.  Cough. EXAM: CHEST  2 VIEW COMPARISON:  CT chest 10/26/2016 FINDINGS: There is mild lingular atelectasis. There is no focal consolidation. There is no pleural effusion or pneumothorax. The heart and mediastinal contours are unremarkable. The osseous structures are unremarkable. IMPRESSION: No active cardiopulmonary disease. Electronically Signed   By: Elige Ko   On: 05/09/2017 14:39    Procedures Procedures (including critical care time)  Medications Ordered in ED Medications  insulin aspart (novoLOG) injection 0-5 Units (3 Units Subcutaneous Given 05/09/17 2334)  insulin aspart (novoLOG) injection 0-15 Units (not administered)  pantoprazole (PROTONIX) EC tablet 40 mg (not administered)  PARoxetine (PAXIL) tablet 10 mg (not administered)  famotidine (PEPCID) tablet 10 mg (not administered)  acetaminophen (TYLENOL) tablet 650 mg (not administered)    Or  acetaminophen (TYLENOL) suppository 650 mg (not administered)  HYDROcodone-acetaminophen (NORCO/VICODIN) 5-325 MG per tablet 1-2 tablet (not administered)  ondansetron (ZOFRAN) tablet 4 mg (not administered)    Or  ondansetron (ZOFRAN) injection 4 mg (not administered)  nicotine (NICODERM CQ - dosed in mg/24 hours) patch 14 mg (not administered)  methylPREDNISolone sodium succinate (SOLU-MEDROL) 125 mg/2 mL injection 60 mg (60 mg Intravenous Given 05/09/17 2336)    Followed by  predniSONE (DELTASONE) tablet 40 mg (not administered)  ipratropium-albuterol (DUONEB) 0.5-2.5 (3) MG/3ML nebulizer solution 3 mL (3 mLs Nebulization Not Given 05/09/17 2242)  albuterol (PROVENTIL) (2.5 MG/3ML) 0.083% nebulizer solution 2.5 mg (not administered)  enoxaparin (LOVENOX) injection 40 mg (not administered)  0.9 %  sodium chloride infusion ( Intravenous New Bag/Given 05/09/17 2319)  guaiFENesin (MUCINEX) 12 hr tablet 600 mg (600 mg Oral Given 05/09/17 2318)  azithromycin (ZITHROMAX) 500 mg in sodium chloride 0.9 % 250 mL IVPB (not  administered)  azithromycin (ZITHROMAX) 500 mg in sodium chloride 0.9 % 250 mL IVPB (not administered)  cefTRIAXone (ROCEPHIN) 1 g in sodium chloride 0.9 % 100 mL IVPB (not administered)  acetaminophen (TYLENOL) tablet 650 mg (650 mg Oral Given 05/09/17 1329)  sodium chloride 0.9 % bolus 2,000 mL (0 mLs Intravenous Stopped 05/09/17 1757)  ibuprofen (ADVIL,MOTRIN) tablet 600 mg (600 mg Oral Given 05/09/17 1709)  ipratropium-albuterol (DUONEB) 0.5-2.5 (3) MG/3ML nebulizer solution 3 mL (3 mLs Nebulization Given 05/09/17 1709)  methylPREDNISolone sodium succinate (SOLU-MEDROL) 125 mg/2 mL injection 125 mg (125 mg Intravenous Given 05/09/17 1708)  ipratropium-albuterol (DUONEB)  0.5-2.5 (3) MG/3ML nebulizer solution 3 mL (3 mLs Nebulization Given 05/09/17 1802)  cefTRIAXone (ROCEPHIN) 1 g in sodium chloride 0.9 % 100 mL IVPB (1 g Intravenous New Bag/Given 05/09/17 2319)     Initial Impression / Assessment and Plan / ED Course  I have reviewed the triage vital signs and the nursing notes.  Pertinent labs & imaging results that were available during my care of the patient were reviewed by me and considered in my medical decision making (see chart for details).  Discussed pt presentation and exam findings with Dr. Rosalia Hammers, who agrees with workup.  Discussed results of patient's workup and that she dropped her sats while ambulating.  She agrees to plan for admission.  Clinical Course as of Feb 12 0000  Mon May 09, 2017  2355 Temp: (!) 102.7 F (39.3 C) [CC]    Clinical Course User Index [CC] Cian Costanzo S, PA-C  Rechecked pt. Fluids have been given and pt states she feels improved. She is still coughing on exam and has not been able to cough up any mucous. Repeat pulmonary exam with expiratory wheezes bilaterally and few rhonchi. Will repeat duo neb and re-eval. Satting at 95% on RA. BP 133 systolic. HR in 80s   Pt receiving second neb tx. Will ambulate with pulse ox monitoring after this.  Rechecked  pt after second duo neb. Still has stridor on exam and some wheezing. Decreased air flow throughout. Ambulated with O2 monitoring and pt dropped from 96% to 91%. Will plan for admit for further tx of asthma exacerbation versus early pneumonia. Influenza panel pending. Discussed results and this plan with pt and she is agreeable to stay.  All questions were answered.  Final Clinical Impressions(s) / ED Diagnoses   Final diagnoses:  Exacerbation of asthma, unspecified asthma severity, unspecified whether persistent  Requires oxygen therapy  Sepsis, due to unspecified organism [facility])   52 year old female with a history of asthma and chronic tobacco use presented with a 3-day history of cough, congestion, and midsternal chest pain when coughing. Initially febrile to 102.110F. Also with mild tachycardia to 102 and hypotension to 90s systolic. Tachycardia and hypotension improved with fluids. Fever improved with ibuprofen and tylenol.   Alert and oriented on exam. Wet cough on exam.  Pulmonary exam with decreased air flow throughout, wheezing, and stridor. Labs significant for BMP with elevated blood glucose to 394 with no anion gap.  Clinically does not appear to be in DKA.  CBC normal. Trop normal x2. Initial lactic acid 2.89 but improved to 1.14 with fluids. Glucose rechecked after fluids and improved to 191.  CXR with no focal consolidation and no active cardiopulmonary disease. ECG with normal sinus rhythm,  Rightward axis deviation, and Nonspecific ST abnormality.  Ambulated pt and O2 sats dropped to 91% on RA. New oxygen requirement. Influenza panel still pending, but will likely be positive. Pt will require admission for further treatment. She likely has an asthma/copd exacerbation versus early pneumonia versus viral respiratory infection.   Discussed pt case with Dr. Therisa Doyne who will accept the pt into her care.    ED Discharge Orders    None       Karrie Meres, PA-C 05/10/17  0000    Margarita Grizzle, MD 05/10/17 [phone]

## 2017-05-10 DIAGNOSIS — J101 Influenza due to other identified influenza virus with other respiratory manifestations: Secondary | ICD-10-CM

## 2017-05-10 DIAGNOSIS — J441 Chronic obstructive pulmonary disease with (acute) exacerbation: Secondary | ICD-10-CM

## 2017-05-10 DIAGNOSIS — Z9981 Dependence on supplemental oxygen: Secondary | ICD-10-CM

## 2017-05-10 HISTORY — DX: Influenza due to other identified influenza virus with other respiratory manifestations: J10.1

## 2017-05-10 LAB — PHOSPHORUS: Phosphorus: 2.9 mg/dL (ref 2.5–4.6)

## 2017-05-10 LAB — HIV ANTIBODY (ROUTINE TESTING W REFLEX): HIV Screen 4th Generation wRfx: NONREACTIVE

## 2017-05-10 LAB — GLUCOSE, CAPILLARY
GLUCOSE-CAPILLARY: 258 mg/dL — AB (ref 65–99)
Glucose-Capillary: 281 mg/dL — ABNORMAL HIGH (ref 65–99)
Glucose-Capillary: 328 mg/dL — ABNORMAL HIGH (ref 65–99)
Glucose-Capillary: 327 mg/dL — ABNORMAL HIGH (ref 65–99)

## 2017-05-10 LAB — HEMOGLOBIN A1C
Hgb A1c MFr Bld: 9.3 % — ABNORMAL HIGH (ref 4.8–5.6)
Mean Plasma Glucose: 220.21 mg/dL

## 2017-05-10 LAB — COMPREHENSIVE METABOLIC PANEL
ALK PHOS: 63 U/L (ref 38–126)
ALT: 17 U/L (ref 14–54)
AST: 32 U/L (ref 15–41)
Albumin: 2.8 g/dL — ABNORMAL LOW (ref 3.5–5.0)
Anion gap: 12 (ref 5–15)
BUN: 11 mg/dL (ref 6–20)
CALCIUM: 8.5 mg/dL — AB (ref 8.9–10.3)
CHLORIDE: 104 mmol/L (ref 101–111)
CO2: 21 mmol/L — AB (ref 22–32)
CREATININE: 0.73 mg/dL (ref 0.44–1.00)
GFR calc Af Amer: >60 mL/min (ref >60)
Glucose, Bld: 302 mg/dL — ABNORMAL HIGH (ref 65–99)
Potassium: 3.7 mmol/L (ref 3.5–5.1)
Sodium: 137 mmol/L (ref 135–145)
Total Bilirubin: 0.5 mg/dL (ref 0.3–1.2)
Total Protein: 6.6 g/dL (ref 6.5–8.1)

## 2017-05-10 LAB — CBC
HCT: 36.4 % (ref 36.0–46.0)
Hemoglobin: 11.8 g/dL — ABNORMAL LOW (ref 12.0–15.0)
MCH: 31.1 pg (ref 26.0–34.0)
MCHC: 32.4 g/dL (ref 30.0–36.0)
MCV: 95.8 fL (ref 78.0–100.0)
PLATELETS: 154 10*3/uL (ref 150–400)
RBC: 3.8 MIL/uL — ABNORMAL LOW (ref 3.87–5.11)
RDW: 13.1 % (ref 11.5–15.5)
WBC: 5.4 10*3/uL (ref 4.0–10.5)

## 2017-05-10 LAB — MAGNESIUM: Magnesium: 1.7 mg/dL (ref 1.7–2.4)

## 2017-05-10 LAB — TSH: TSH: 0.129 u[IU]/mL — AB (ref 0.350–4.500)

## 2017-05-10 MED ORDER — IPRATROPIUM-ALBUTEROL 0.5-2.5 (3) MG/3ML IN SOLN
3.0000 mL | RESPIRATORY_TRACT | Status: DC
  Administered 2017-05-10 – 2017-05-11 (×5): 3 mL via RESPIRATORY_TRACT
  Filled 2017-05-10 (×5): qty 3

## 2017-05-10 MED ORDER — METHYLPREDNISOLONE SODIUM SUCC 125 MG IJ SOLR
60.0000 mg | Freq: Two times a day (BID) | INTRAMUSCULAR | Status: DC
  Administered 2017-05-10 – 2017-05-11 (×2): 60 mg via INTRAVENOUS
  Filled 2017-05-10 (×2): qty 2

## 2017-05-10 MED ORDER — ALBUTEROL SULFATE (2.5 MG/3ML) 0.083% IN NEBU: 2.5000 mg | INHALATION_SOLUTION | RESPIRATORY_TRACT | Status: DC | PRN

## 2017-05-10 MED ORDER — OSELTAMIVIR PHOSPHATE 75 MG PO CAPS
75.0000 mg | ORAL_CAPSULE | Freq: Two times a day (BID) | ORAL | Status: DC
  Administered 2017-05-10 – 2017-05-11 (×3): 75 mg via ORAL
  Filled 2017-05-10 (×3): qty 1

## 2017-05-10 NOTE — Care Management Note (Signed)
Case Management Note  Patient Details  Name: Carla [name] MRN: [id number] Date of Birth: May 05, 1965  Subjective/Objective:     Pt admitted with the flu and COPD exacerbation. She is from home alone.  No PCP listed but patient has Medicaid which assigns an MD.                Action/Plan: Plan is for patient to d/c home when medically stable. CM following.   Expected Discharge Date:                  Expected Discharge Plan:  Home/Self Care  In-House Referral:     Discharge planning Services     Post Acute Care Choice:    Choice offered to:     DME Arranged:    DME Agency:     HH Arranged:    HH Agency:     Status of Service:  In process, will continue to follow  If discussed at Long Length of Stay Meetings, dates discussed:    Additional Comments:  Kermit Balo, RN 05/10/2017, 11:59 AM

## 2017-05-10 NOTE — Progress Notes (Signed)
PROGRESS NOTE    Koraline Bares  CWC:[id number] DOB: 04-09-1965 DOA: 05/09/2017 PCP: Patient, No Pcp Per  Brief Narrative: Karron Kovacevic is a 52 y.o. female with medical history significant of asthma, DM 2, HTN, tobacco abuse presented with 3 days of shortness of breath, overall feeling poorly had having some headaches and coughing productive of greenish sputum for the past 3 days. Flu positive  Assessment & Plan:   Flu A with COPD exacerbation -Improving, start Tamiflu, continue IV steroids today -Stop ceftriaxone and azithromycin -Duonebs every 6 and PRN -stop IVF  Tobacco abuse  -counseled, continue nicotine patch  Type 2 diabetes -Glipizide and metformin on hold -Sliding-scale insulin for now  Hypertension -Blood pressure improved, low normal range now, lisinopril and amlodipine and on hold -Resume slowly in the next 24 hours  DVT prophylaxis: Lovenox Code Status: Full code Family Communication: No family at bedside Disposition Plan: Home in 24-48 hours pending clinical improvement   Procedures:   Antimicrobials:  Antibiotics Given (last 72 hours)    Date/Time Action Medication Dose Rate   05/09/17 2319 New Bag/Given   cefTRIAXone (ROCEPHIN) 1 g in sodium chloride 0.9 % 100 mL IVPB 1 g 200 mL/hr   05/10/17 0021 New Bag/Given   azithromycin (ZITHROMAX) 500 mg in sodium chloride 0.9 % 250 mL IVPB 500 mg 250 mL/hr   05/10/17 0834 Given   oseltamivir (TAMIFLU) capsule 75 mg 75 mg       Subjective: -Breathing better, still coughing and wheezing, not close to baseline yet  Objective: Vitals:   05/09/17 2240 05/10/17 0102 05/10/17 0526 05/10/17 0839  BP: (!) 137/92 133/79 129/68 126/68  Pulse: 73 79 66 77  Resp: 18 18 18 20   Temp: 98.6 F (37 C) 98 F (36.7 C) 98.4 F (36.9 C) (!) 97.5 F (36.4 C)  TempSrc: Oral Oral Oral Oral  SpO2: 95% 96% 96% 97%  Weight: 72.4 kg (159 lb 9.8 oz)     Height: 5\' 2"  (1.575 m)       Intake/Output Summary (Last 24  hours) at 05/10/2017 1102 Last data filed at 05/10/2017 7616 Gross per 24 hour  Intake 3053.75 ml  Output -  Net 3053.75 ml   Filed Weights   05/09/17 2240  Weight: 72.4 kg (159 lb 9.8 oz)    Examination:  General exam: Thinly built anxious female, sitting up in bed, no distress Respiratory system: Poor air movement and scattered expiratory wheezes bilaterally Cardiovascular system: S1-S2/regular rate rhythm, tachycardic  Gastrointestinal system: Abdomen is nondistended, soft and nontender.Normal bowel sounds heard. Central nervous system: Alert and oriented. No focal neurological deficits. Extremities: Symmetric 5 x 5 power. Skin: No rashes, lesions or ulcers Psychiatry: Judgement and insight appear normal. Mood & affect appropriate.     Data Reviewed:   CBC: Recent Labs  Lab 05/09/17 1324 05/10/17 0541  WBC 9.0 5.4  HGB 13.0 11.8*  HCT 39.5 36.4  MCV 98.0 95.8  PLT 164 154   Basic Metabolic Panel: Recent Labs  Lab 05/09/17 1324 05/10/17 0541  NA 134* 137  K 4.3 3.7  CL 98* 104  CO2 22 21*  GLUCOSE 395* 302*  BUN 10 11  CREATININE 0.99 0.73  CALCIUM 9.1 8.5*  MG  --  1.7  PHOS  --  2.9   GFR: Estimated Creatinine Clearance: 78.4 mL/min (by C-G formula based on SCr of 0.73 mg/dL). Liver Function Tests: Recent Labs  Lab 05/10/17 0541  AST 32  ALT 17  ALKPHOS 63  BILITOT 0.5  PROT 6.6  ALBUMIN 2.8*   No results for input(s): LIPASE, AMYLASE in the last 168 hours. No results for input(s): AMMONIA in the last 168 hours. Coagulation Profile: No results for input(s): INR, PROTIME in the last 168 hours. Cardiac Enzymes: No results for input(s): CKTOTAL, CKMB, CKMBINDEX, TROPONINI in the last 168 hours. BNP (last 3 results) No results for input(s): PROBNP in the last 8760 hours. HbA1C: Recent Labs    05/10/17 0541  HGBA1C 9.3*   CBG: Recent Labs  Lab 05/09/17 1720 05/09/17 2237 05/10/17 0604  GLUCAP 191* 265* 281*   Lipid Profile: No  results for input(s): CHOL, HDL, LDLCALC, TRIG, CHOLHDL, LDLDIRECT in the last 72 hours. Thyroid Function Tests: Recent Labs    05/10/17 0541  TSH 0.129*   Anemia Panel: No results for input(s): VITAMINB12, FOLATE, FERRITIN, TIBC, IRON, RETICCTPCT in the last 72 hours. Urine analysis: No results found for: COLORURINE, APPEARANCEUR, LABSPEC, PHURINE, GLUCOSEU, HGBUR, BILIRUBINUR, KETONESUR, PROTEINUR, UROBILINOGEN, NITRITE, LEUKOCYTESUR Sepsis Labs: @LABRCNTIP (procalcitonin:4,lacticidven:4)  )No results found for this or any previous visit (from the past 240 hour(s)).       Radiology Studies: Dg Chest 2 View  Result Date: 05/09/2017 CLINICAL DATA:  Chest pain started yesterday.  Cough. EXAM: CHEST  2 VIEW COMPARISON:  CT chest 10/26/2016 FINDINGS: There is mild lingular atelectasis. There is no focal consolidation. There is no pleural effusion or pneumothorax. The heart and mediastinal contours are unremarkable. The osseous structures are unremarkable. IMPRESSION: No active cardiopulmonary disease. Electronically Signed   By: Elige Ko   On: 05/09/2017 14:39        Scheduled Meds: . enoxaparin (LOVENOX) injection  40 mg Subcutaneous Q24H  . famotidine  10 mg Oral BID  . guaiFENesin  600 mg Oral BID  . insulin aspart  0-15 Units Subcutaneous TID WC  . insulin aspart  0-5 Units Subcutaneous QHS  . ipratropium-albuterol  3 mL Nebulization Q4H  . methylPREDNISolone (SOLU-MEDROL) injection  60 mg Intravenous Q12H   Followed by  . predniSONE  40 mg Oral Q supper  . nicotine  14 mg Transdermal Daily  . oseltamivir  75 mg Oral BID  . pantoprazole  40 mg Oral Daily  . PARoxetine  10 mg Oral Daily   Continuous Infusions:   LOS: 1 day    Time spent:    Zannie Cove, MD [facility] Page via [email or web address], password [email or web address] After 7PM please contact night-coverage  05/10/2017, 11:02 AM

## 2017-05-10 NOTE — Progress Notes (Signed)
Nutrition Brief Note  Patient identified on the Malnutrition Screening Tool (MST) Report  Wt Readings from Last 15 Encounters:  05/09/17 159 lb 9.8 oz (72.4 kg)  02/08/13 160 lb (72.6 kg)   Weight stable, PO good, No needs.  Body mass index is 29.19 kg/m. Patient meets criteria for overweight based on current BMI.   Current diet order is carb modified, patient is consuming approximately 75-100% of meals at this time. Labs and medications reviewed.   No nutrition interventions warranted at this time. If nutrition issues arise, please consult RD.   Carla [name]. Carla Broome, MS, RD LDN Inpatient Clinical Dietitian Pager [phone]

## 2017-05-11 ENCOUNTER — Encounter (HOSPITAL_COMMUNITY): Payer: Self-pay

## 2017-05-11 DIAGNOSIS — J101 Influenza due to other identified influenza virus with other respiratory manifestations: Secondary | ICD-10-CM

## 2017-05-11 LAB — GLUCOSE, CAPILLARY
GLUCOSE-CAPILLARY: 401 mg/dL — AB (ref 65–99)
GLUCOSE-CAPILLARY: 316 mg/dL — AB (ref 65–99)
Glucose-Capillary: 348 mg/dL — ABNORMAL HIGH (ref 65–99)
Glucose-Capillary: 205 mg/dL — ABNORMAL HIGH (ref 65–99)

## 2017-05-11 LAB — RESPIRATORY PANEL BY PCR
Adenovirus: NOT DETECTED
Bordetella pertussis: NOT DETECTED
CORONAVIRUS HKU1-RVPPCR: NOT DETECTED
CORONAVIRUS NL63-RVPPCR: NOT DETECTED
CORONAVIRUS OC43-RVPPCR: NOT DETECTED
Chlamydophila pneumoniae: NOT DETECTED
Coronavirus 229E: NOT DETECTED
INFLUENZA A H3-RVPPCR: DETECTED — AB
INFLUENZA B-RVPPCR: NOT DETECTED
Influenza A H1 2009: NOT DETECTED
Influenza A H1: NOT DETECTED
Influenza A: NOT DETECTED
METAPNEUMOVIRUS-RVPPCR: NOT DETECTED
Mycoplasma pneumoniae: NOT DETECTED
PARAINFLUENZA VIRUS 1-RVPPCR: NOT DETECTED
PARAINFLUENZA VIRUS 2-RVPPCR: NOT DETECTED
PARAINFLUENZA VIRUS 3-RVPPCR: NOT DETECTED
Parainfluenza Virus 4: NOT DETECTED
RHINOVIRUS / ENTEROVIRUS - RVPPCR: NOT DETECTED
Respiratory Syncytial Virus: NOT DETECTED

## 2017-05-11 MED ORDER — MOMETASONE FURO-FORMOTEROL FUM 200-5 MCG/ACT IN AERO: 2.0000 | INHALATION_SPRAY | Freq: Two times a day (BID) | RESPIRATORY_TRACT | 4 refills | Status: AC

## 2017-05-11 MED ORDER — GUAIFENESIN ER 600 MG PO TB12: 600.0000 mg | ORAL_TABLET | Freq: Two times a day (BID) | ORAL | 0 refills | Status: DC

## 2017-05-11 MED ORDER — ALBUTEROL SULFATE HFA 108 (90 BASE) MCG/ACT IN AERS: 2.0000 | INHALATION_SPRAY | Freq: Four times a day (QID) | RESPIRATORY_TRACT | 5 refills | Status: AC | PRN

## 2017-05-11 MED ORDER — PREDNISONE 10 MG PO TABS: ORAL_TABLET | ORAL | 0 refills | Status: DC

## 2017-05-11 MED ORDER — OSELTAMIVIR PHOSPHATE 75 MG PO CAPS: 75.0000 mg | ORAL_CAPSULE | Freq: Two times a day (BID) | ORAL | 0 refills | Status: DC

## 2017-05-11 MED ORDER — GLIPIZIDE 5 MG PO TABS: 5.0000 mg | ORAL_TABLET | Freq: Two times a day (BID) | ORAL | Status: DC

## 2017-05-11 MED ORDER — INSULIN ASPART 100 UNIT/ML ~~LOC~~ SOLN
4.0000 [IU] | Freq: Three times a day (TID) | SUBCUTANEOUS | Status: DC
  Administered 2017-05-11: 4 [IU] via SUBCUTANEOUS

## 2017-05-11 MED ORDER — GLIPIZIDE 5 MG PO TABS
5.0000 mg | ORAL_TABLET | Freq: Two times a day (BID) | ORAL | Status: DC
  Administered 2017-05-11: 5 mg via ORAL
  Filled 2017-05-11: qty 1

## 2017-05-11 MED ORDER — MOMETASONE FURO-FORMOTEROL FUM 200-5 MCG/ACT IN AERO
2.0000 | INHALATION_SPRAY | Freq: Two times a day (BID) | RESPIRATORY_TRACT | Status: DC
  Filled 2017-05-11: qty 8.8

## 2017-05-11 MED ORDER — METFORMIN HCL 500 MG PO TABS: 1000.0000 mg | ORAL_TABLET | Freq: Two times a day (BID) | ORAL | Status: DC

## 2017-05-11 MED ORDER — PREDNISONE 20 MG PO TABS
60.0000 mg | ORAL_TABLET | Freq: Once | ORAL | Status: AC
  Administered 2017-05-11: 60 mg via ORAL
  Filled 2017-05-11: qty 3

## 2017-05-11 MED ORDER — METFORMIN HCL 1000 MG PO TABS: 1000.0000 mg | ORAL_TABLET | Freq: Two times a day (BID) | ORAL | 4 refills | Status: DC

## 2017-05-11 MED ORDER — IPRATROPIUM-ALBUTEROL 0.5-2.5 (3) MG/3ML IN SOLN: 3.0000 mL | Freq: Three times a day (TID) | RESPIRATORY_TRACT | Status: DC

## 2017-05-11 NOTE — Care Management Note (Signed)
Case Management Note  Patient Details  Name: Carla [name] MRN: [id number] Date of Birth: Jul 05, 1965  Subjective/Objective:                    Action/Plan: Pt discharging home with self care. Pt has no PCP listed. Pt has Medicaid who assigns a PCP. Pt to call her CM with her Medicaid and schedule a hospital f/u with her assigned PCP.  Pt has transportation home.    Expected Discharge Date:  05/11/17               Expected Discharge Plan:  Home/Self Care  In-House Referral:     Discharge planning Services     Post Acute Care Choice:    Choice offered to:     DME Arranged:    DME Agency:     HH Arranged:    HH Agency:     Status of Service:  Completed, signed off  If discussed at [employer] of Stay Meetings, dates discussed:    Additional Comments:  Kermit Balo, RN 05/11/2017, 10:47 AM

## 2017-05-11 NOTE — Progress Notes (Signed)
Inpatient Diabetes Program Recommendations  AACE/ADA: New Consensus Statement on Inpatient Glycemic Control (2015)  Target Ranges:  Prepandial:   less than 140 mg/dL      Peak postprandial:   less than 180 mg/dL (1-2 hours)      Critically ill patients:  140 - 180 mg/dL   Results for SHELL, BLANCHETTE (MRN [id number]) as of 05/11/2017 08:09  Ref. Range 05/10/2017 06:04 05/10/2017 11:25 05/10/2017 16:43 05/10/2017 21:01 05/11/2017 06:07 05/11/2017 06:48 05/11/2017 07:42  Glucose-Capillary Latest Ref Range: 65 - 99 mg/dL 782 (H) 956 (H) 213 (H) 327 (H) 401 (H) 348 (H) 316 (H)   Review of Glycemic Control  Diabetes history: DM 2 Outpatient Diabetes medications: Glipizide 5 mg BID, Metformin 850 mg BID Current orders for Inpatient glycemic control: Novolog Moderate Correction 0-15 units tid + Novolog HS scale 0-9 units tid.  Inpatient Diabetes Program Recommendations:    Hyperglycemia in the setting of steroid use (IV Solumedrol 60 mg q12). Please consider Lantus 12 units.  Thanks,  Christena Deem RN, MSN, [facility] Inpatient Diabetes Coordinator Team Pager [phone] (8a-5p)

## 2017-05-11 NOTE — Discharge Summary (Addendum)
Physician Discharge Summary   Patient ID: Carla [name] MRN: [id number] DOB/AGE: Jun 18, 1965 52 y.o.  Admit date: 05/09/2017 Discharge date: 05/11/2017  Primary Care Physician:  Patient, No Pcp Per  Discharge Diagnoses:    . COPD with acute exacerbation (HCC) . Sepsis (HCC) . COPD exacerbation  . Essential HTN  . Influenza A  Uncontrolled diabetes mellitus type 2   Consults: None  Recommendations for Outpatient Follow-up:  1. Complete the course of Tamiflu 2. Please repeat CBC/BMET at next visit   DIET: Heart healthy diet    Allergies:  No Known Allergies   DISCHARGE MEDICATIONS: Allergies as of 05/11/2017   No Known Allergies     Medication List    TAKE these medications   albuterol 108 (90 Base) MCG/ACT inhaler Commonly known as:  PROVENTIL HFA;VENTOLIN HFA Inhale 2 puffs into the lungs every 6 (six) hours as needed for wheezing.   amLODipine 10 MG tablet Commonly known as:  NORVASC Take 10 mg by mouth daily.   glipiZIDE 5 MG tablet Commonly known as:  GLUCOTROL Take 5 mg by mouth 2 (two) times daily.   guaiFENesin 600 MG 12 hr tablet Commonly known as:  MUCINEX Take 1 tablet (600 mg total) by mouth 2 (two) times daily.   ibuprofen 200 MG tablet Commonly known as:  ADVIL,MOTRIN Take 400 mg by mouth every 6 (six) hours as needed for mild pain.   lisinopril 20 MG tablet Commonly known as:  PRINIVIL,ZESTRIL Take 20 mg by mouth daily.   metFORMIN 1000 MG tablet Commonly known as:  GLUCOPHAGE Take 1 tablet (1,000 mg total) by mouth 2 (two) times daily with a meal. What changed:    medication strength  how much to take  when to take this   mometasone-formoterol 200-5 MCG/ACT Aero Commonly known as:  DULERA Inhale 2 puffs into the lungs 2 (two) times daily.   omeprazole 40 MG capsule Commonly known as:  PRILOSEC Take 40 mg by mouth daily.   oseltamivir 75 MG capsule Commonly known as:  TAMIFLU Take 1 capsule (75 mg total) by mouth 2  (two) times daily.   PARoxetine 10 MG tablet Commonly known as:  PAXIL Take 10 mg by mouth daily.   predniSONE 10 MG tablet Commonly known as:  DELTASONE Prednisone dosing: Take  Prednisone 40mg  (4 tabs) x 2 days, then taper to 30mg  (3 tabs) x 2 days, then 20mg  (2 tabs) x 2 days, then 10mg  (1 tab) x 2 days, then OFF.   ranitidine 300 MG capsule Commonly known as:  ZANTAC Take 300 mg by mouth every evening.        Brief H and P: For complete details please refer to admission H and P, but in brief Carla [name] a 52 y.o.femalewith medical history significant of asthma, DM 2,HTN, tobacco abuse presented with3 days of shortness of breath,overall feeling poorly had having some headaches and coughing productive of greenish sputum for the past 3 days. Flu positive   Hospital Course:   Influenza A complicated with COPD exacerbation - Improving, patient was placed on a Zithromax, ceftriaxone - Flu PCR was positive for influenza A, patient was started on Tamiflu  - HIV negative. Sepsis ruled out.  - Patient will complete the course of Tamiflu, antibiotics were discontinued - No further fever or chills or coughing, clear to be discharged home  COPD exacerbation - Patient was placed on scheduled nebs, placed on dulera and albuterol inhaler on discharge. - No significant wheezing, placed on prednisone  taper on discharge  Uncontrolled diabetes mellitus Hemoglobin A1c 9.3, hyperglycemia due to steroids Metformin increased to 1000 mg twice a day, continue glipizide 5 mg twice a day Patient recommended shortly to follow-up with PCP  Hypertension Currently improved, continue amlodipine, lisinopril  Tobacco abuse - Counseled strongly on tobacco cessation, she was placed on nicotine patch  Day of Discharge  Feels a lot better, and wheezing improved. No fevers or chills   BP 134/78 (BP Location: Left Arm)   Pulse 78   Temp 98 F (36.7 C) (Oral)   Resp 18   Ht 5\' 2"   (1.575 m)   Wt 72.4 kg (159 lb 9.8 oz)   SpO2 96%   BMI 29.19 kg/m   Physical Exam: General: Alert and awake oriented x3 not in any acute distress. HEENT: anicteric sclera, pupils reactive to light and accommodation CVS: S1-S2 clear no murmur rubs or gallops Chest: clear to auscultation bilaterally, no wheezing rales or rhonchi Abdomen: soft nontender, nondistended, normal bowel sounds Extremities: no cyanosis, clubbing or edema noted bilaterally Neuro: Cranial nerves II-XII intact, no focal neurological deficits   The results of significant diagnostics from this hospitalization (including imaging, microbiology, ancillary and laboratory) are listed below for reference.    LAB RESULTS: Basic Metabolic Panel: Recent Labs  Lab 05/09/17 1324 05/10/17 0541  NA 134* 137  K 4.3 3.7  CL 98* 104  CO2 22 21*  GLUCOSE 395* 302*  BUN 10 11  CREATININE 0.99 0.73  CALCIUM 9.1 8.5*  MG  --  1.7  PHOS  --  2.9   Liver Function Tests: Recent Labs  Lab 05/10/17 0541  AST 32  ALT 17  ALKPHOS 63  BILITOT 0.5  PROT 6.6  ALBUMIN 2.8*   No results for input(s): LIPASE, AMYLASE in the last 168 hours. No results for input(s): AMMONIA in the last 168 hours. CBC: Recent Labs  Lab 05/09/17 1324 05/10/17 0541  WBC 9.0 5.4  HGB 13.0 11.8*  HCT 39.5 36.4  MCV 98.0 95.8  PLT 164 154   Cardiac Enzymes: No results for input(s): CKTOTAL, CKMB, CKMBINDEX, TROPONINI in the last 168 hours. BNP: Invalid input(s): POCBNP CBG: Recent Labs  Lab 05/11/17 0648 05/11/17 0742  GLUCAP 348* 316*    Significant Diagnostic Studies:  Dg Chest 2 View  Result Date: 05/09/2017 CLINICAL DATA:  Chest pain started yesterday.  Cough. EXAM: CHEST  2 VIEW COMPARISON:  CT chest 10/26/2016 FINDINGS: There is mild lingular atelectasis. There is no focal consolidation. There is no pleural effusion or pneumothorax. The heart and mediastinal contours are unremarkable. The osseous structures are unremarkable.  IMPRESSION: No active cardiopulmonary disease. Electronically Signed   By: Elige Ko   On: 05/09/2017 14:39    2D ECHO:   Disposition and Follow-up: Discharge Instructions    Discharge instructions   Complete by:  As directed    Please continue albuterol inhaler 3 times a day for next days, then as needed for wheezing and shortness of breath   Increase activity slowly   Complete by:  As directed        DISPOSITION: Home  DISCHARGE FOLLOW-UP Follow-up Information    [facility]. Schedule an appointment as soon as possible for a visit in 2 week(s).   Contact information: [address] [place] [address] [phone]           Time spent on Discharge: 25 minutes  Signed:   Arkin Imran M.D.  [facility] 05/11/2017, 11:24 AM Pager: [phone]

## 2017-05-11 NOTE — Progress Notes (Signed)
Patient current CBG 401.   RN contacted provider on call - Provider advised to coverage patient based on CBG of 400 on sliding scale.  Sliding scale for 400 is 15 units of insulin  RN will continue to monitor patient

## 2017-05-11 NOTE — Progress Notes (Addendum)
Patient with discharge order and awaiting for daughter to pick her up.   Discharge instructions reviewed with patient.All questions answered at this time. Daughter unable to come. Bus pass offered. Patient is discharged. All belongings sent with patient.   Sim Boast, RN

## 2017-05-15 LAB — CULTURE, BLOOD (ROUTINE X 2)
CULTURE: NO GROWTH
Culture: NO GROWTH
SPECIAL REQUESTS: ADEQUATE
SPECIAL REQUESTS: ADEQUATE — AB

## 2018-01-15 ENCOUNTER — Other Ambulatory Visit: Payer: Self-pay

## 2018-01-15 ENCOUNTER — Encounter (HOSPITAL_COMMUNITY): Payer: Self-pay | Admitting: Emergency Medicine

## 2018-01-15 ENCOUNTER — Emergency Department (HOSPITAL_COMMUNITY)
Admission: EM | Admit: 2018-01-15 | Discharge: 2018-01-15 | Disposition: A | Discharge status: Home / Self Care | Payer: Medicaid Other | Attending: Emergency Medicine | Admitting: Emergency Medicine

## 2018-01-15 DIAGNOSIS — I10 Essential (primary) hypertension: Secondary | ICD-10-CM | POA: Diagnosis not present

## 2018-01-15 DIAGNOSIS — E119 Type 2 diabetes mellitus without complications: Secondary | ICD-10-CM | POA: Diagnosis not present

## 2018-01-15 DIAGNOSIS — J449 Chronic obstructive pulmonary disease, unspecified: Secondary | ICD-10-CM | POA: Diagnosis not present

## 2018-01-15 DIAGNOSIS — N644 Mastodynia: Secondary | ICD-10-CM | POA: Diagnosis present

## 2018-01-15 DIAGNOSIS — Z79899 Other long term (current) drug therapy: Secondary | ICD-10-CM | POA: Diagnosis not present

## 2018-01-15 DIAGNOSIS — F17210 Nicotine dependence, cigarettes, uncomplicated: Secondary | ICD-10-CM | POA: Insufficient documentation

## 2018-01-15 DIAGNOSIS — N611 Abscess of the breast and nipple: Secondary | ICD-10-CM | POA: Insufficient documentation

## 2018-01-15 HISTORY — DX: Unspecified speech disturbances: R47.9

## 2018-01-15 HISTORY — DX: Type 2 diabetes mellitus without complications: E11.9

## 2018-01-15 LAB — CBC WITH DIFFERENTIAL/PLATELET
Abs Immature Granulocytes: 0.01 10*3/uL (ref 0.00–0.07)
BASOS ABS: 0 10*3/uL (ref 0.0–0.1)
Basophils Relative: 1 %
EOS PCT: 1 %
Eosinophils Absolute: 0 10*3/uL (ref 0.0–0.5)
HCT: 49.6 % — ABNORMAL HIGH (ref 36.0–46.0)
HEMOGLOBIN: 15.6 g/dL — AB (ref 12.0–15.0)
Immature Granulocytes: 0 %
LYMPHS ABS: 2.4 10*3/uL (ref 0.7–4.0)
LYMPHS PCT: 37 %
MCH: 31.1 pg (ref 26.0–34.0)
MCHC: 31.5 g/dL (ref 30.0–36.0)
MCV: 99 fL (ref 80.0–100.0)
MONO ABS: 0.4 10*3/uL (ref 0.1–1.0)
MONOS PCT: 7 %
Neutro Abs: 3.5 10*3/uL (ref 1.7–7.7)
Neutrophils Relative %: 54 %
Platelets: 231 10*3/uL (ref 150–400)
RBC: 5.01 MIL/uL (ref 3.87–5.11)
RDW: 12.8 % (ref 11.5–15.5)
WBC: 6.3 10*3/uL (ref 4.0–10.5)
nRBC: 0 % (ref 0.0–0.2)

## 2018-01-15 LAB — BASIC METABOLIC PANEL
Anion gap: 7 (ref 5–15)
BUN: 10 mg/dL (ref 6–20)
CHLORIDE: 106 mmol/L (ref 98–111)
CO2: 28 mmol/L (ref 22–32)
CREATININE: 1.03 mg/dL — AB (ref 0.44–1.00)
Calcium: 9.6 mg/dL (ref 8.9–10.3)
GFR calc non Af Amer: >60 mL/min (ref >60)
Glucose, Bld: 92 mg/dL (ref 70–99)
Potassium: 3.9 mmol/L (ref 3.5–5.1)
Sodium: 141 mmol/L (ref 135–145)

## 2018-01-15 MED ORDER — MORPHINE SULFATE (PF) 2 MG/ML IV SOLN
2.0000 mg | Freq: Once | INTRAVENOUS | Status: AC
  Administered 2018-01-15: 2 mg via INTRAVENOUS
  Filled 2018-01-15: qty 1

## 2018-01-15 MED ORDER — LIDOCAINE HCL 2 % IJ SOLN
20.0000 mL | Freq: Once | INTRAMUSCULAR | Status: AC
  Administered 2018-01-15: 400 mg

## 2018-01-15 MED ORDER — CLINDAMYCIN PHOSPHATE 600 MG/50ML IV SOLN
600.0000 mg | Freq: Once | INTRAVENOUS | Status: AC
  Administered 2018-01-15: 600 mg via INTRAVENOUS
  Filled 2018-01-15: qty 50

## 2018-01-15 MED ORDER — NAPROXEN 375 MG PO TABS: 375.0000 mg | ORAL_TABLET | Freq: Two times a day (BID) | ORAL | 0 refills | Status: DC

## 2018-01-15 MED ORDER — SODIUM CHLORIDE 0.9 % IV SOLN
Freq: Once | INTRAVENOUS | Status: AC
  Administered 2018-01-15: 18:00:00 via INTRAVENOUS

## 2018-01-15 MED ORDER — LIDOCAINE HCL 2 % IJ SOLN
INTRAMUSCULAR | Status: AC
  Filled 2018-01-15: qty 20

## 2018-01-15 MED ORDER — HYDROCODONE-ACETAMINOPHEN 5-325 MG PO TABS
1.0000 | ORAL_TABLET | Freq: Once | ORAL | Status: AC
  Administered 2018-01-15: 1 via ORAL
  Filled 2018-01-15: qty 1

## 2018-01-15 MED ORDER — CLINDAMYCIN HCL 300 MG PO CAPS: 300.0000 mg | ORAL_CAPSULE | Freq: Three times a day (TID) | ORAL | 0 refills | Status: DC
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
## 2018-01-15 NOTE — ED Triage Notes (Signed)
Pt with abscess to R areola / nipple x 1 week. Pt states it has been draining.

## 2018-01-15 NOTE — ED Notes (Signed)
Pt stated that she is pain free after the aspiration of fluid from breast and he pain medication

## 2018-01-15 NOTE — Discharge Instructions (Signed)
[facility] should call you tomorrow to schedule follow up. Take the antibiotics as directed and apply warm wet compresses to the area.

## 2018-01-15 NOTE — ED Triage Notes (Signed)
Per EMS- pt called EMS due acute l/breast pain x 1 week, increased pain last night. Took one motrin this am. Pt is alert, ambulatory.

## 2018-01-16 ENCOUNTER — Other Ambulatory Visit (HOSPITAL_COMMUNITY): Payer: Self-pay | Admitting: Nurse Practitioner

## 2018-01-16 DIAGNOSIS — N611 Abscess of the breast and nipple: Secondary | ICD-10-CM

## 2018-01-18 LAB — AEROBIC CULTURE W GRAM STAIN (SUPERFICIAL SPECIMEN)

## 2018-01-18 LAB — AEROBIC CULTURE  (SUPERFICIAL SPECIMEN)

## 2018-01-19 ENCOUNTER — Telehealth: Payer: Self-pay | Admitting: *Deleted

## 2018-01-19 NOTE — Progress Notes (Signed)
ED Antimicrobial Stewardship Positive Culture Follow Up   Carla [name] is an 52 y.o. female who presented to [facility] on 01/15/2018 with a chief complaint of  Chief Complaint  Patient presents with  . Breast Mass  . Abscess    Recent Results (from the past 720 hour(s))  Wound or Superficial Culture     Status: None   Collection Time: 01/15/18  6:01 PM  Result Value Ref Range Status   Specimen Description BREAST ABSCESS  Final   Special Requests   Final    NONE Performed at [facility], [address] [place], [place] [address]    Gram Stain   Final    ABUNDANT WBC PRESENT, PREDOMINANTLY PMN ABUNDANT GRAM POSITIVE COCCI Performed at [facility], [address], [place], [place] [address]    Culture MODERATE STAPHYLOCOCCUS LUGDUNENSIS  Final   Report Status 01/18/2018 FINAL  Final   Organism ID, Bacteria STAPHYLOCOCCUS LUGDUNENSIS  Final      Susceptibility   Staphylococcus lugdunensis - MIC*    CIPROFLOXACIN <=0.5 SENSITIVE Sensitive     ERYTHROMYCIN >=8 RESISTANT Resistant     GENTAMICIN <=0.5 SENSITIVE Sensitive     OXACILLIN 0.5 SENSITIVE Sensitive     TETRACYCLINE <=1 SENSITIVE Sensitive     VANCOMYCIN <=0.5 SENSITIVE Sensitive     TRIMETH/SULFA <=10 SENSITIVE Sensitive     CLINDAMYCIN RESISTANT Resistant     RIFAMPIN <=0.5 SENSITIVE Sensitive     Inducible Clindamycin POSITIVE Resistant     * MODERATE STAPHYLOCOCCUS LUGDUNENSIS    [x]  Treated with clindamycin, organism resistant to prescribed antimicrobial []  Patient discharged originally without antimicrobial agent and treatment is now indicated  New antibiotic prescription: DC clindamycin, start bactrim DS 1 tablet PO BID x 7 days  ED Provider: Army Melia, PA   [name], Drake Leach 01/19/2018, 9:50 AM Clinical Pharmacist Monday - Friday phone -  [phone] Saturday - Sunday phone - [phone]

## 2018-01-19 NOTE — Telephone Encounter (Signed)
Post ED Visit - Positive Culture Follow-up: Unsuccessful Patient Follow-up  Culture assessed and recommendations reviewed by:  []  Enzo Bi, Pharm.D. []  Celedonio Miyamoto, Pharm.D., BCPS AQ-ID []  Garvin Fila, Pharm.D., BCPS []  Georgina Pillion, [address].D., BCPS []  [place], [address].D., BCPS, AAHIVP []  Estella Husk, Pharm.D., BCPS, AAHIVP []  Sherlynn Carbon, PharmD []  Pollyann Samples, PharmD, BCPS Lysle Pearl, PharmD  Positive wound culture, rviewed by Army Melia, PA-C  []  Patient discharged without antimicrobial prescription and treatment is now indicated [x]  Organism is resistant to prescribed ED discharge antimicrobial []  Patient with positive blood cultures  Plan: D/C Clindamycin.  Start Bactrim DS 1 tab PO BID x 7 days  Unable to contact patient after 3 attempts, letter will be sent to address on file  Lysle Pearl 01/19/2018, 10:08 AM

## 2019-05-27 ENCOUNTER — Emergency Department (HOSPITAL_COMMUNITY): Payer: Medicaid Other

## 2019-05-27 ENCOUNTER — Encounter (HOSPITAL_COMMUNITY): Payer: Self-pay | Admitting: Emergency Medicine

## 2019-05-27 ENCOUNTER — Other Ambulatory Visit: Payer: Self-pay

## 2019-05-27 ENCOUNTER — Emergency Department (HOSPITAL_COMMUNITY)
Admission: EM | Admit: 2019-05-27 | Discharge: 2019-05-28 | Disposition: A | Discharge status: Home / Self Care | Payer: Medicaid Other | Attending: Emergency Medicine | Admitting: Emergency Medicine

## 2019-05-27 DIAGNOSIS — R0602 Shortness of breath: Secondary | ICD-10-CM | POA: Insufficient documentation

## 2019-05-27 DIAGNOSIS — Z5321 Procedure and treatment not carried out due to patient leaving prior to being seen by health care provider: Secondary | ICD-10-CM | POA: Diagnosis not present

## 2019-05-27 DIAGNOSIS — R05 Cough: Secondary | ICD-10-CM | POA: Diagnosis not present

## 2019-05-27 LAB — CBC WITH DIFFERENTIAL/PLATELET
Abs Immature Granulocytes: 0.01 10*3/uL (ref 0.00–0.07)
Basophils Absolute: 0 10*3/uL (ref 0.0–0.1)
Basophils Relative: 1 %
Eosinophils Absolute: 0 10*3/uL (ref 0.0–0.5)
Eosinophils Relative: 1 %
HCT: 47.9 % — ABNORMAL HIGH (ref 36.0–46.0)
Hemoglobin: 15.8 g/dL — ABNORMAL HIGH (ref 12.0–15.0)
Immature Granulocytes: 0 %
Lymphocytes Relative: 52 %
Lymphs Abs: 2.3 10*3/uL (ref 0.7–4.0)
MCH: 32.2 pg (ref 26.0–34.0)
MCHC: 33 g/dL (ref 30.0–36.0)
MCV: 97.6 fL (ref 80.0–100.0)
Monocytes Absolute: 0.3 10*3/uL (ref 0.1–1.0)
Monocytes Relative: 8 %
Neutro Abs: 1.6 10*3/uL — ABNORMAL LOW (ref 1.7–7.7)
Neutrophils Relative %: 38 %
Platelets: 253 10*3/uL (ref 150–400)
RBC: 4.91 MIL/uL (ref 3.87–5.11)
RDW: 12.3 % (ref 11.5–15.5)
WBC: 4.3 10*3/uL (ref 4.0–10.5)
nRBC: 0 % (ref 0.0–0.2)

## 2019-05-27 LAB — COMPREHENSIVE METABOLIC PANEL
ALT: 15 U/L (ref 0–44)
AST: 20 U/L (ref 15–41)
Albumin: 4.3 g/dL (ref 3.5–5.0)
Alkaline Phosphatase: 75 U/L (ref 38–126)
Anion gap: 14 (ref 5–15)
BUN: 11 mg/dL (ref 6–20)
CO2: 28 mmol/L (ref 22–32)
Calcium: 10.2 mg/dL (ref 8.9–10.3)
Chloride: 103 mmol/L (ref 98–111)
Creatinine, Ser: 0.85 mg/dL (ref 0.44–1.00)
GFR calc Af Amer: >60 mL/min (ref >60)
GFR calc non Af Amer: >60 mL/min (ref >60)
Glucose, Bld: 103 mg/dL — ABNORMAL HIGH (ref 70–99)
Potassium: 3.1 mmol/L — ABNORMAL LOW (ref 3.5–5.1)
Sodium: 145 mmol/L (ref 135–145)
Total Bilirubin: 1 mg/dL (ref 0.3–1.2)
Total Protein: 8.3 g/dL — ABNORMAL HIGH (ref 6.5–8.1)

## 2019-05-27 LAB — I-STAT BETA HCG BLOOD, ED (MC, WL, AP ONLY): I-stat hCG, quantitative: <5 m[IU]/mL (ref <5)

## 2019-05-27 IMAGING — DX DG CHEST 2V
2 series · 2 of 2 positions shown · non-contrast
Comparison: [DATE]

CLINICAL DATA: Shortness of breath with chest congestion

EXAM:
CHEST - 2 VIEW

[chest pa]
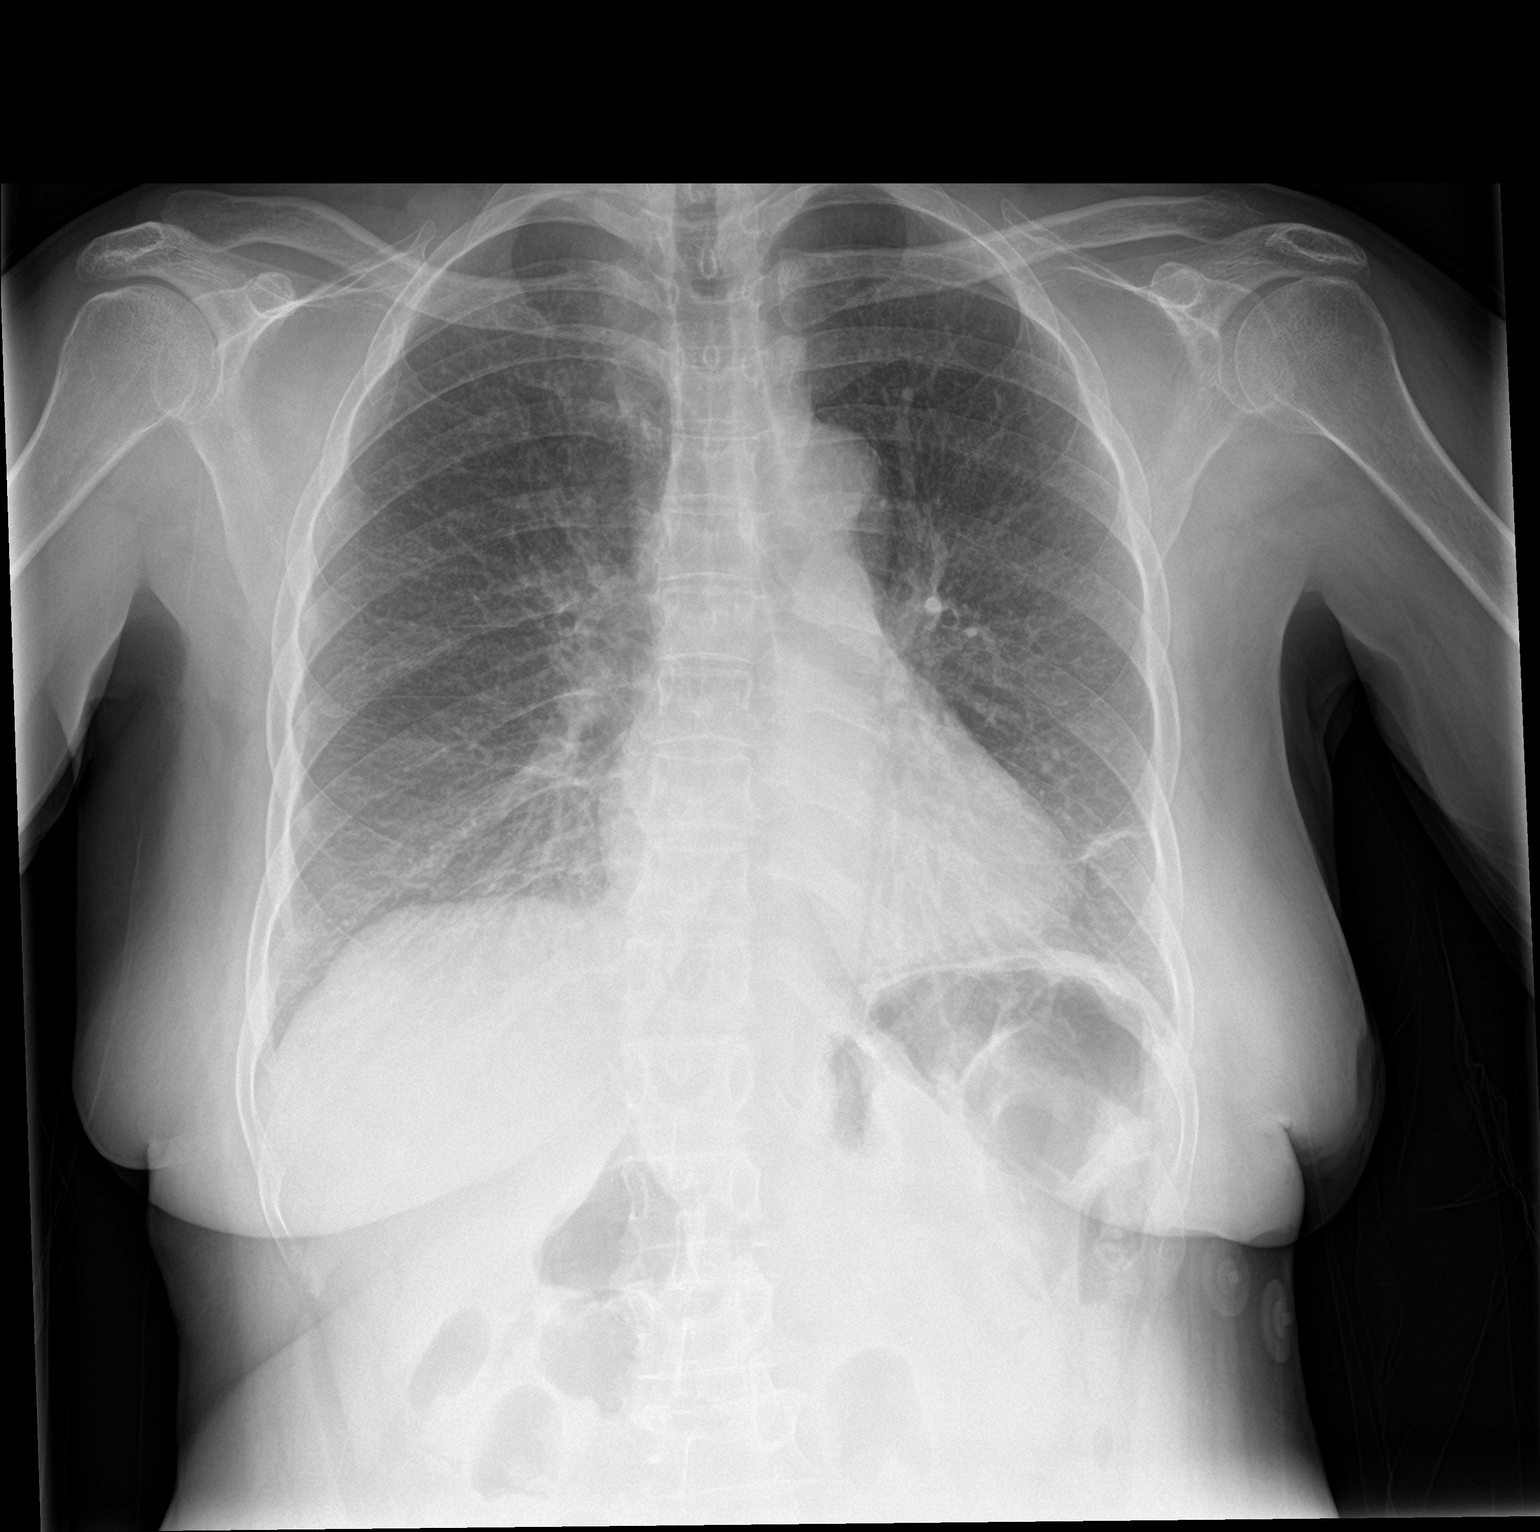

[chest lat]
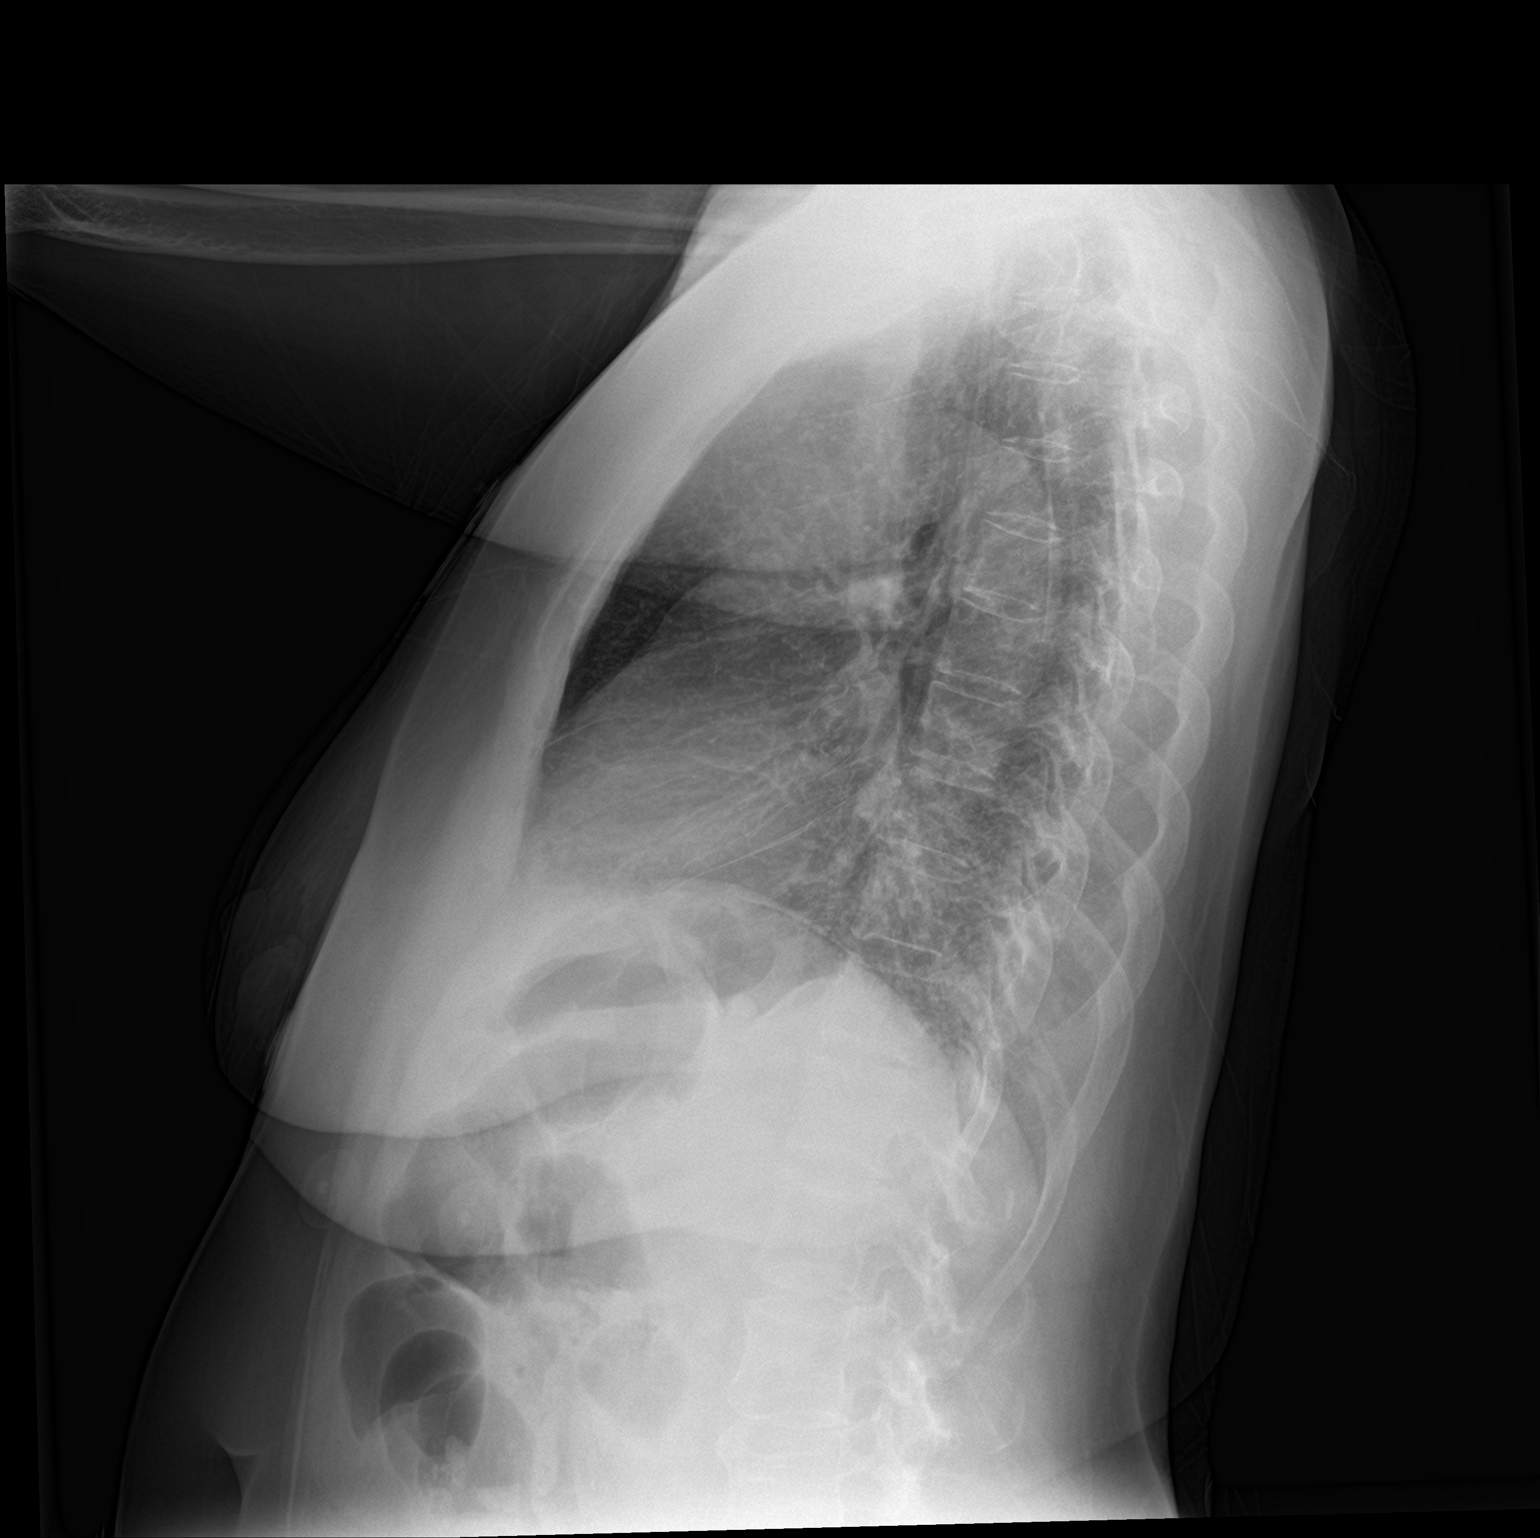

[2 of 2 positions shown; findings below may reference images not displayed]

FINDINGS: There is scarring versus atelectasis in the left lower lung zone,
stable from prior study. There appears to be some bronchial wall
thickening. The heart size is stable. There may be mild aortic
calcifications. There is no pneumothorax or large pleural effusion.
There is suggestion of a hazy airspace opacity at the right lung
base. Slightly prominent interstitial lung markings are noted.
IMPRESSION: 1. Findings suggestive of infectious bronchiolitis.
2. Stable scarring versus atelectasis in the left lower lung zone.

## 2019-05-27 NOTE — ED Triage Notes (Addendum)
Patient reports SOB this week with chest congestion/occasional dry cough  and poor appetite/fatigue .

## 2019-05-28 NOTE — ED Notes (Signed)
Pt left AMA. Pt was aware of risks and was encouraged to stay. Pt stated she felt much better and wanted to go home.

## 2021-01-08 ENCOUNTER — Encounter (HOSPITAL_COMMUNITY): Payer: Self-pay | Admitting: Pharmacy Technician

## 2021-01-08 ENCOUNTER — Other Ambulatory Visit: Payer: Self-pay

## 2021-01-08 ENCOUNTER — Emergency Department (HOSPITAL_COMMUNITY)
Admission: EM | Admit: 2021-01-08 | Discharge: 2021-01-09 | Disposition: A | Discharge status: Home / Self Care | Payer: Medicaid Other | Attending: Emergency Medicine | Admitting: Emergency Medicine

## 2021-01-08 DIAGNOSIS — E860 Dehydration: Secondary | ICD-10-CM | POA: Insufficient documentation

## 2021-01-08 DIAGNOSIS — R062 Wheezing: Secondary | ICD-10-CM | POA: Insufficient documentation

## 2021-01-08 DIAGNOSIS — Z5321 Procedure and treatment not carried out due to patient leaving prior to being seen by health care provider: Secondary | ICD-10-CM | POA: Diagnosis not present

## 2021-01-08 DIAGNOSIS — J45909 Unspecified asthma, uncomplicated: Secondary | ICD-10-CM | POA: Diagnosis not present

## 2021-01-08 DIAGNOSIS — R630 Anorexia: Secondary | ICD-10-CM | POA: Diagnosis present

## 2021-01-08 LAB — COMPREHENSIVE METABOLIC PANEL
ALT: 13 U/L (ref 0–44)
AST: 20 U/L (ref 15–41)
Albumin: 3.9 g/dL (ref 3.5–5.0)
Alkaline Phosphatase: 70 U/L (ref 38–126)
Anion gap: 10 (ref 5–15)
BUN: 9 mg/dL (ref 6–20)
CO2: 25 mmol/L (ref 22–32)
Calcium: 9.6 mg/dL (ref 8.9–10.3)
Chloride: 102 mmol/L (ref 98–111)
Creatinine, Ser: 0.66 mg/dL (ref 0.44–1.00)
GFR, Estimated: >60 mL/min (ref >60)
Glucose, Bld: 65 mg/dL — ABNORMAL LOW (ref 70–99)
Potassium: 4 mmol/L (ref 3.5–5.1)
Sodium: 137 mmol/L (ref 135–145)
Total Bilirubin: 0.5 mg/dL (ref 0.3–1.2)
Total Protein: 8.1 g/dL (ref 6.5–8.1)

## 2021-01-08 LAB — LIPASE, BLOOD: Lipase: 26 U/L (ref 11–51)

## 2021-01-08 LAB — CBC
HCT: 44.3 % (ref 36.0–46.0)
Hemoglobin: 14.3 g/dL (ref 12.0–15.0)
MCH: 32.2 pg (ref 26.0–34.0)
MCHC: 32.3 g/dL (ref 30.0–36.0)
MCV: 99.8 fL (ref 80.0–100.0)
Platelets: 233 10*3/uL (ref 150–400)
RBC: 4.44 MIL/uL (ref 3.87–5.11)
RDW: 12.9 % (ref 11.5–15.5)
WBC: 4.1 10*3/uL (ref 4.0–10.5)
nRBC: 0 % (ref 0.0–0.2)

## 2021-01-08 LAB — I-STAT BETA HCG BLOOD, ED (MC, WL, AP ONLY): I-stat hCG, quantitative: <5 m[IU]/mL (ref <5)

## 2021-01-08 NOTE — ED Triage Notes (Signed)
Pt bib ems from home with reports of decreased po intake X2 days. Pt concerned for dehydration. Expiratory wheeze, hx asthma.

## 2021-01-08 NOTE — ED Notes (Signed)
Pt had a bowel movement and urinated. Staff cleaned pt up and placed pt into paper scrubs and a brief.

## 2021-01-08 NOTE — ED Provider Notes (Signed)
Emergency Medicine Provider Triage Evaluation Note  Carla [name] , a 55 y.o. female  was evaluated in triage.  Pt complains of bowel/bladder incontinence over the last 2 days.  The significant other states that she has been trying to get to the bathroom but is not able to.  She denies chest pain, shortness of breath, abdominal pain, nausea, vomiting, hematochezia, melena, hematuria.  Review of Systems  Positive:  Negative: See above  Physical Exam  BP (!) 166/83 (BP Location: Right Arm)   Pulse (!) 57   Temp 97.8 F (36.6 C) (Oral)   Resp 16   SpO2 100%  Gen:   Awake, no distress   Resp:  Normal effort  MSK:   Moves extremities without difficulty  Other:  Abdomen is nontender to palpation.  Medical Decision Making  Medically screening exam initiated at 3:12 PM.  Appropriate orders placed.  Carla [name] was informed that the remainder of the evaluation will be completed by another provider, this initial triage assessment does not replace that evaluation, and the importance of remaining in the ED until their evaluation is complete.     Carla Loh Galva, PA-C 01/08/21 1514    [name], Carla Brim, MD 01/08/21 1640

## 2021-01-08 NOTE — ED Notes (Signed)
Pt left AMA °

## 2021-01-09 ENCOUNTER — Encounter (HOSPITAL_BASED_OUTPATIENT_CLINIC_OR_DEPARTMENT_OTHER): Payer: Self-pay | Admitting: *Deleted

## 2021-01-09 ENCOUNTER — Emergency Department (HOSPITAL_BASED_OUTPATIENT_CLINIC_OR_DEPARTMENT_OTHER): Payer: Medicaid Other

## 2021-01-09 ENCOUNTER — Inpatient Hospital Stay (HOSPITAL_BASED_OUTPATIENT_CLINIC_OR_DEPARTMENT_OTHER)
Admission: EM | Admit: 2021-01-09 | Discharge: 2021-01-15 | DRG: 884 | Disposition: A | Discharge status: Skilled Nursing Facility | Payer: Medicaid Other | Attending: Internal Medicine | Admitting: Internal Medicine

## 2021-01-09 ENCOUNTER — Other Ambulatory Visit: Payer: Self-pay

## 2021-01-09 DIAGNOSIS — R159 Full incontinence of feces: Secondary | ICD-10-CM | POA: Diagnosis not present

## 2021-01-09 DIAGNOSIS — I10 Essential (primary) hypertension: Secondary | ICD-10-CM | POA: Diagnosis present

## 2021-01-09 DIAGNOSIS — A549 Gonococcal infection, unspecified: Secondary | ICD-10-CM | POA: Diagnosis not present

## 2021-01-09 DIAGNOSIS — Z87820 Personal history of traumatic brain injury: Secondary | ICD-10-CM | POA: Diagnosis not present

## 2021-01-09 DIAGNOSIS — F0390 Unspecified dementia without behavioral disturbance: Secondary | ICD-10-CM | POA: Diagnosis not present

## 2021-01-09 DIAGNOSIS — I739 Peripheral vascular disease, unspecified: Secondary | ICD-10-CM | POA: Diagnosis not present

## 2021-01-09 DIAGNOSIS — A568 Sexually transmitted chlamydial infection of other sites: Secondary | ICD-10-CM | POA: Diagnosis present

## 2021-01-09 DIAGNOSIS — F0150 Vascular dementia without behavioral disturbance: Principal | ICD-10-CM | POA: Diagnosis present

## 2021-01-09 DIAGNOSIS — E1165 Type 2 diabetes mellitus with hyperglycemia: Secondary | ICD-10-CM | POA: Diagnosis not present

## 2021-01-09 DIAGNOSIS — Z7951 Long term (current) use of inhaled steroids: Secondary | ICD-10-CM

## 2021-01-09 DIAGNOSIS — R410 Disorientation, unspecified: Secondary | ICD-10-CM

## 2021-01-09 DIAGNOSIS — Z791 Long term (current) use of non-steroidal anti-inflammatories (NSAID): Secondary | ICD-10-CM

## 2021-01-09 DIAGNOSIS — A5909 Other urogenital trichomoniasis: Secondary | ICD-10-CM | POA: Diagnosis not present

## 2021-01-09 DIAGNOSIS — R1310 Dysphagia, unspecified: Secondary | ICD-10-CM | POA: Diagnosis present

## 2021-01-09 DIAGNOSIS — I680 Cerebral amyloid angiopathy: Secondary | ICD-10-CM | POA: Diagnosis not present

## 2021-01-09 DIAGNOSIS — Z6820 Body mass index (BMI) 20.0-20.9, adult: Secondary | ICD-10-CM | POA: Diagnosis not present

## 2021-01-09 DIAGNOSIS — R64 Cachexia: Secondary | ICD-10-CM | POA: Diagnosis present

## 2021-01-09 DIAGNOSIS — Z833 Family history of diabetes mellitus: Secondary | ICD-10-CM

## 2021-01-09 DIAGNOSIS — R531 Weakness: Secondary | ICD-10-CM

## 2021-01-09 DIAGNOSIS — R32 Unspecified urinary incontinence: Secondary | ICD-10-CM | POA: Diagnosis not present

## 2021-01-09 DIAGNOSIS — E43 Unspecified severe protein-calorie malnutrition: Secondary | ICD-10-CM | POA: Diagnosis not present

## 2021-01-09 DIAGNOSIS — E854 Organ-limited amyloidosis: Secondary | ICD-10-CM | POA: Diagnosis present

## 2021-01-09 DIAGNOSIS — Z8249 Family history of ischemic heart disease and other diseases of the circulatory system: Secondary | ICD-10-CM

## 2021-01-09 DIAGNOSIS — R4189 Other symptoms and signs involving cognitive functions and awareness: Secondary | ICD-10-CM | POA: Diagnosis present

## 2021-01-09 DIAGNOSIS — Z20822 Contact with and (suspected) exposure to covid-19: Secondary | ICD-10-CM | POA: Diagnosis not present

## 2021-01-09 DIAGNOSIS — F17210 Nicotine dependence, cigarettes, uncomplicated: Secondary | ICD-10-CM | POA: Diagnosis not present

## 2021-01-09 DIAGNOSIS — R292 Abnormal reflex: Secondary | ICD-10-CM | POA: Diagnosis not present

## 2021-01-09 DIAGNOSIS — Z79899 Other long term (current) drug therapy: Secondary | ICD-10-CM

## 2021-01-09 DIAGNOSIS — G35 Multiple sclerosis: Secondary | ICD-10-CM | POA: Diagnosis not present

## 2021-01-09 DIAGNOSIS — J449 Chronic obstructive pulmonary disease, unspecified: Secondary | ICD-10-CM | POA: Diagnosis present

## 2021-01-09 DIAGNOSIS — Z7189 Other specified counseling: Secondary | ICD-10-CM

## 2021-01-09 DIAGNOSIS — R627 Adult failure to thrive: Secondary | ICD-10-CM | POA: Diagnosis present

## 2021-01-09 DIAGNOSIS — Z7984 Long term (current) use of oral hypoglycemic drugs: Secondary | ICD-10-CM

## 2021-01-09 DIAGNOSIS — R262 Difficulty in walking, not elsewhere classified: Secondary | ICD-10-CM | POA: Diagnosis present

## 2021-01-09 DIAGNOSIS — R471 Dysarthria and anarthria: Secondary | ICD-10-CM | POA: Diagnosis present

## 2021-01-09 LAB — URINALYSIS, MICROSCOPIC (REFLEX): RBC / HPF: NONE SEEN RBC/hpf (ref 0–5)

## 2021-01-09 LAB — CBG MONITORING, ED: Glucose-Capillary: 73 mg/dL (ref 70–99)

## 2021-01-09 LAB — URINALYSIS, ROUTINE W REFLEX MICROSCOPIC
Bilirubin Urine: NEGATIVE
Glucose, UA: NEGATIVE mg/dL
Hgb urine dipstick: NEGATIVE
Ketones, ur: NEGATIVE mg/dL
Nitrite: NEGATIVE
Protein, ur: NEGATIVE mg/dL
Specific Gravity, Urine: 1.03 (ref 1.005–1.030)
pH: 7 (ref 5.0–8.0)

## 2021-01-09 LAB — LACTIC ACID, PLASMA: Lactic Acid, Venous: 0.8 mmol/L (ref 0.5–1.9)

## 2021-01-09 LAB — RESP PANEL BY RT-PCR (FLU A&B, COVID) ARPGX2
Influenza A by PCR: NEGATIVE
Influenza B by PCR: NEGATIVE
SARS Coronavirus 2 by RT PCR: NEGATIVE

## 2021-01-09 LAB — TROPONIN I (HIGH SENSITIVITY): Troponin I (High Sensitivity): 4 ng/L (ref <18)

## 2021-01-09 LAB — AMMONIA: Ammonia: 12 umol/L (ref 9–35)

## 2021-01-09 IMAGING — CT CT HEAD W/O CM
3 series · 15 of 47 positions shown, 18 images · non-contrast
Comparison: None.

CLINICAL DATA: Altered mental status

EXAM:
CT HEAD WITHOUT CONTRAST
TECHNIQUE: Contiguous axial images were obtained from the base of the skull
through the vertex without intravenous contrast.

[Series 3: head wo · axial · 0.46mm/px · z∈[-509,-384]mm · 9 of 30 slices shown, 12 images]
[im 3/30  brain]
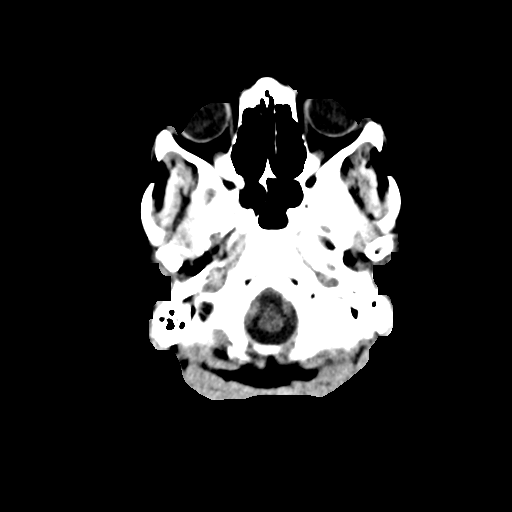
[im 3/30  bone]
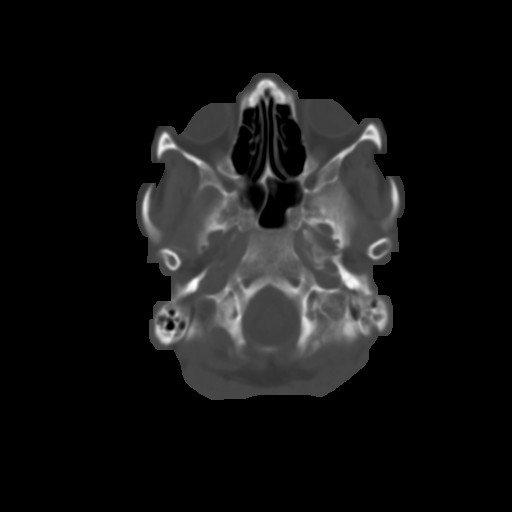
[im 6/30  brain]
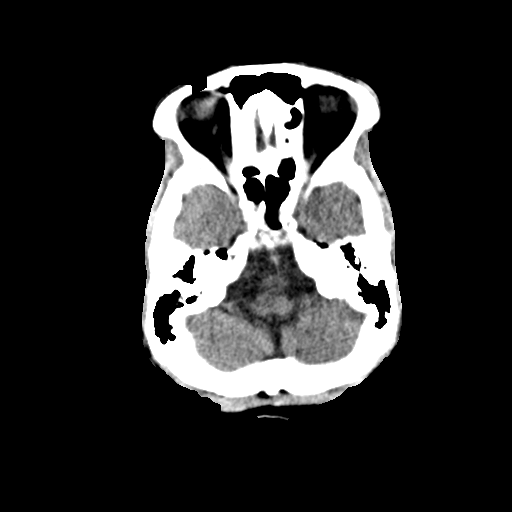
[im 9/30  brain]
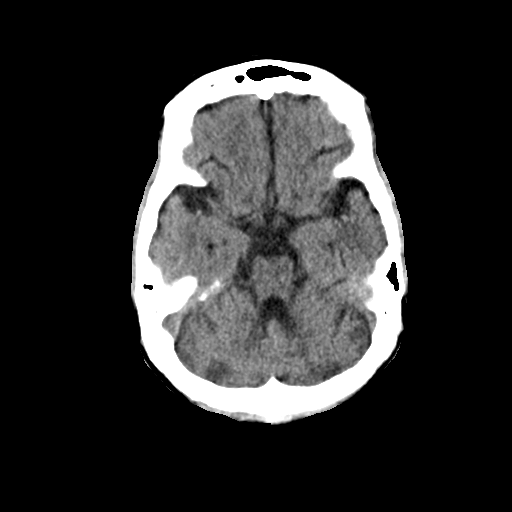
[im 12/30  brain]
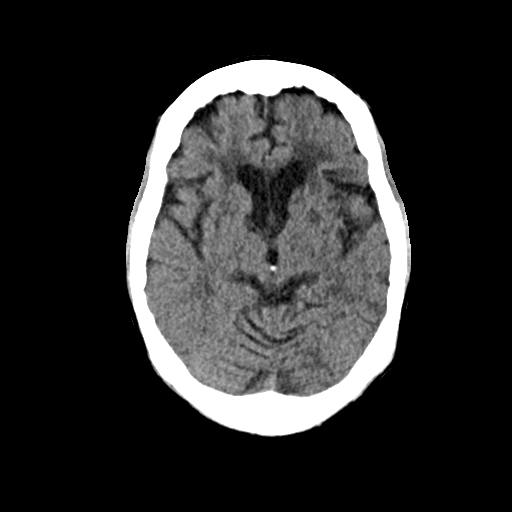
[im 16/30  brain]
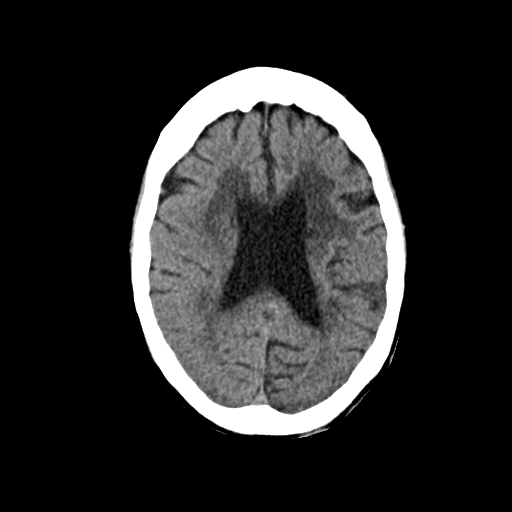
[im 16/30  bone]
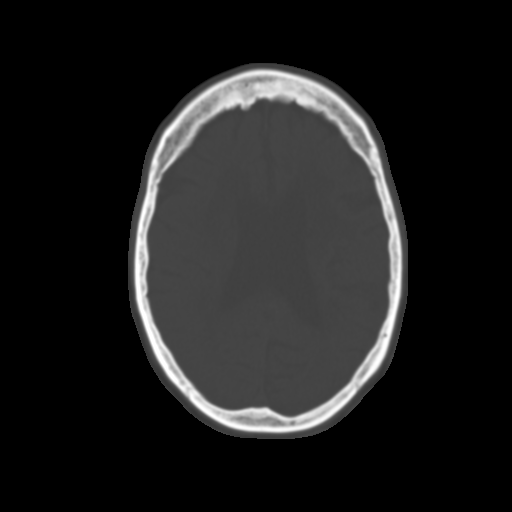
[im 19/30  brain]
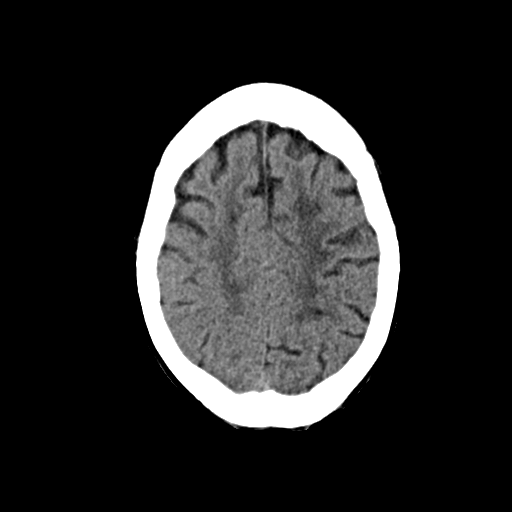
[im 22/30  brain]
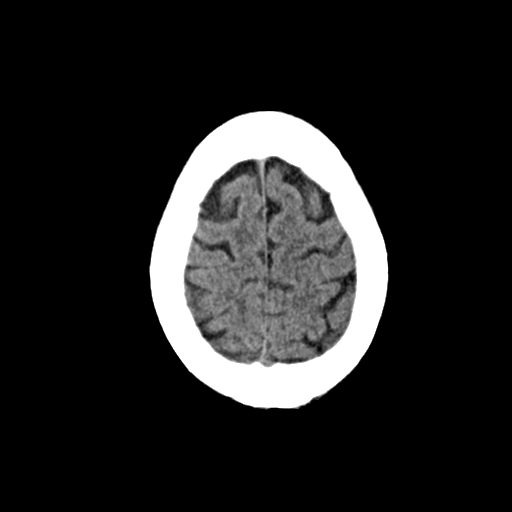
[im 25/30  brain]
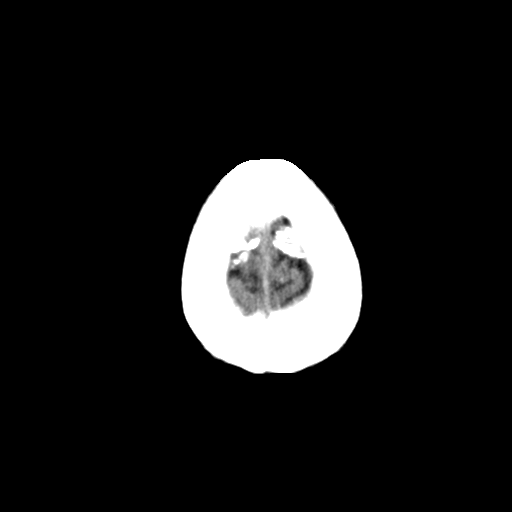
[im 28/30  brain]
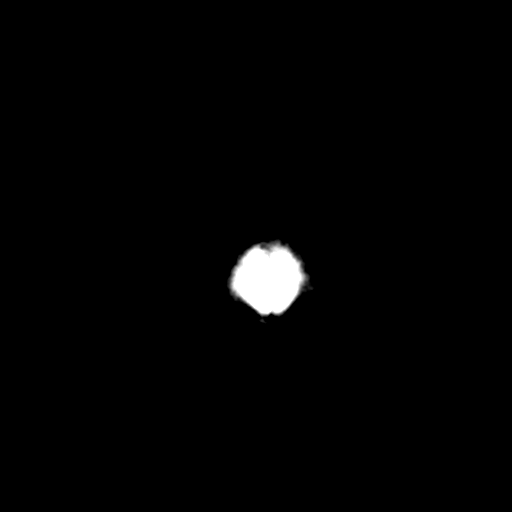
[im 28/30  bone]
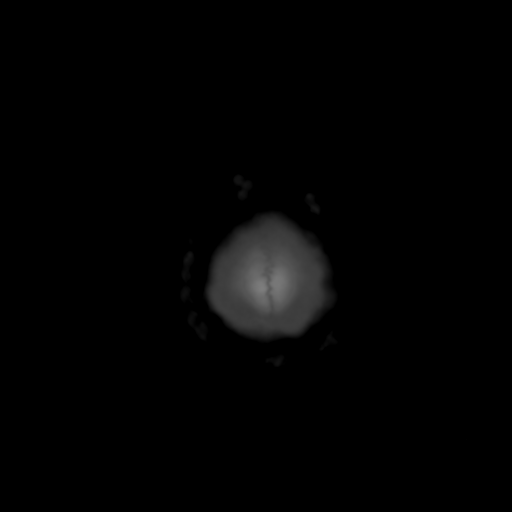

[Series 5: coronal soft · coronal · 0.29mm/px · 3 of 65 slices shown]
[im 22/65  brain]
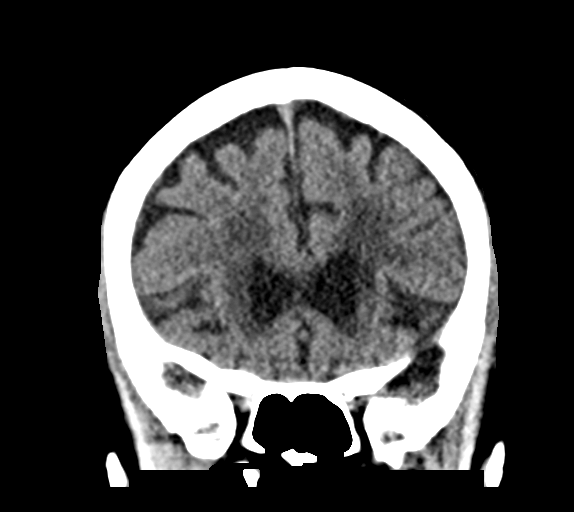
[im 29/65  brain]
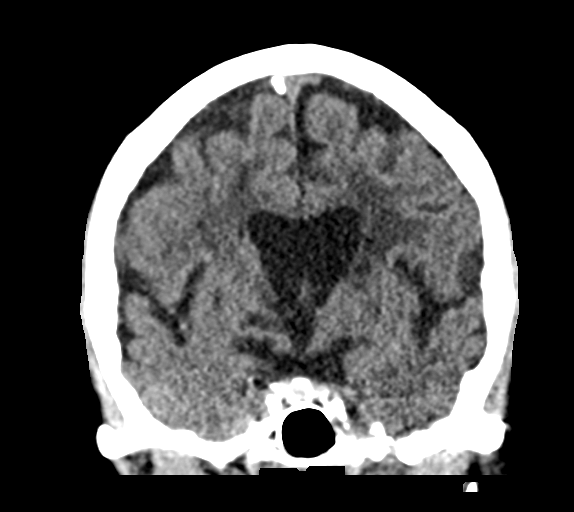
[im 36/65  brain]
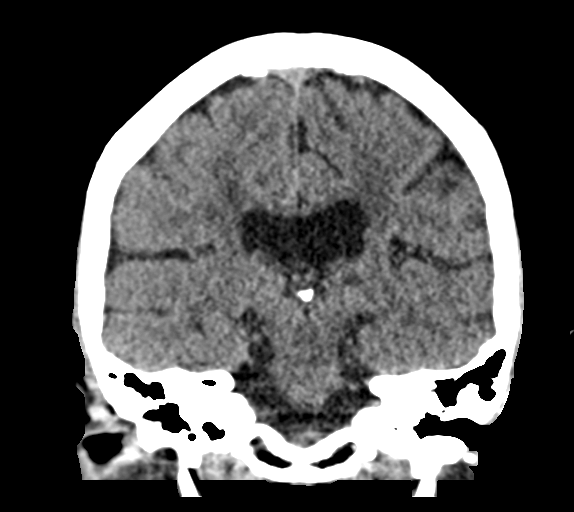

[Series 6: sag soft · sagittal · 0.29mm/px · 3 of 50 slices shown]
[im 17/50  brain]
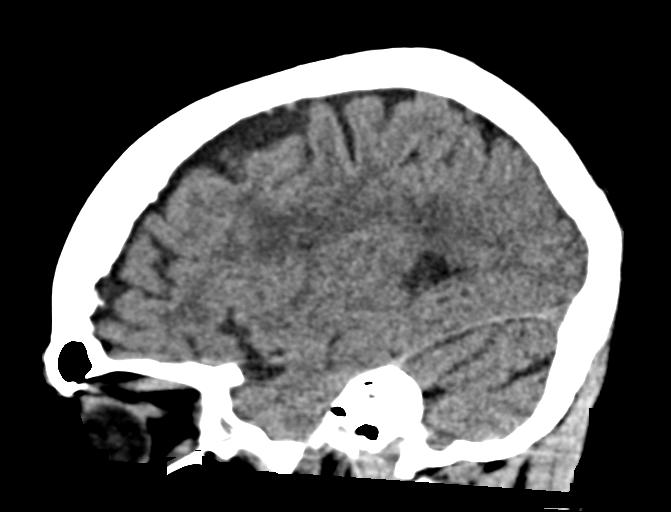
[im 25/50  brain]
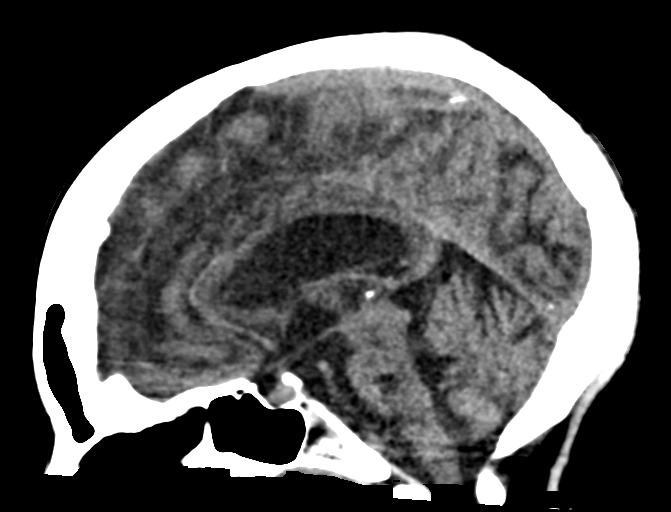
[im 33/50  brain]
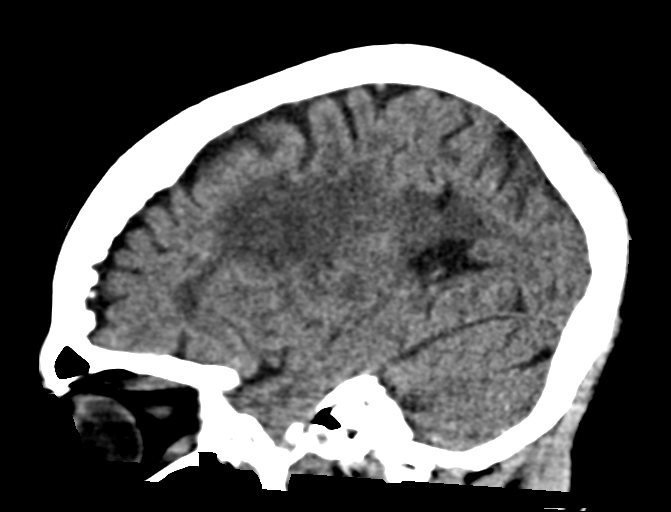

[15 of 47 positions shown; findings below may reference images not displayed]

FINDINGS: Brain: No evidence of acute infarction, hemorrhage, hydrocephalus,
extra-axial collection or mass lesion/mass effect. Mild atrophic
changes are noted. Mild chronic white matter ischemic changes are
seen as well. The clear infarcts are noted within the basal ganglia
bilaterally adjacent to the head of the caudate nucleus. Similar
findings are noted in the mid pons as well.

Vascular: No hyperdense vessel or unexpected calcification.

Skull: Normal. Negative for fracture or focal lesion.

Sinuses/Orbits: No acute finding.

Other: None.
IMPRESSION: Chronic atrophic and ischemic changes without acute abnormality.

## 2021-01-09 IMAGING — DX DG CHEST 1V PORT
1 series · 1 of 1 positions shown · non-contrast
Comparison: [DATE]

CLINICAL DATA: Altered mental status, history of tobacco use

EXAM:
PORTABLE CHEST 1 VIEW

[chest ap]
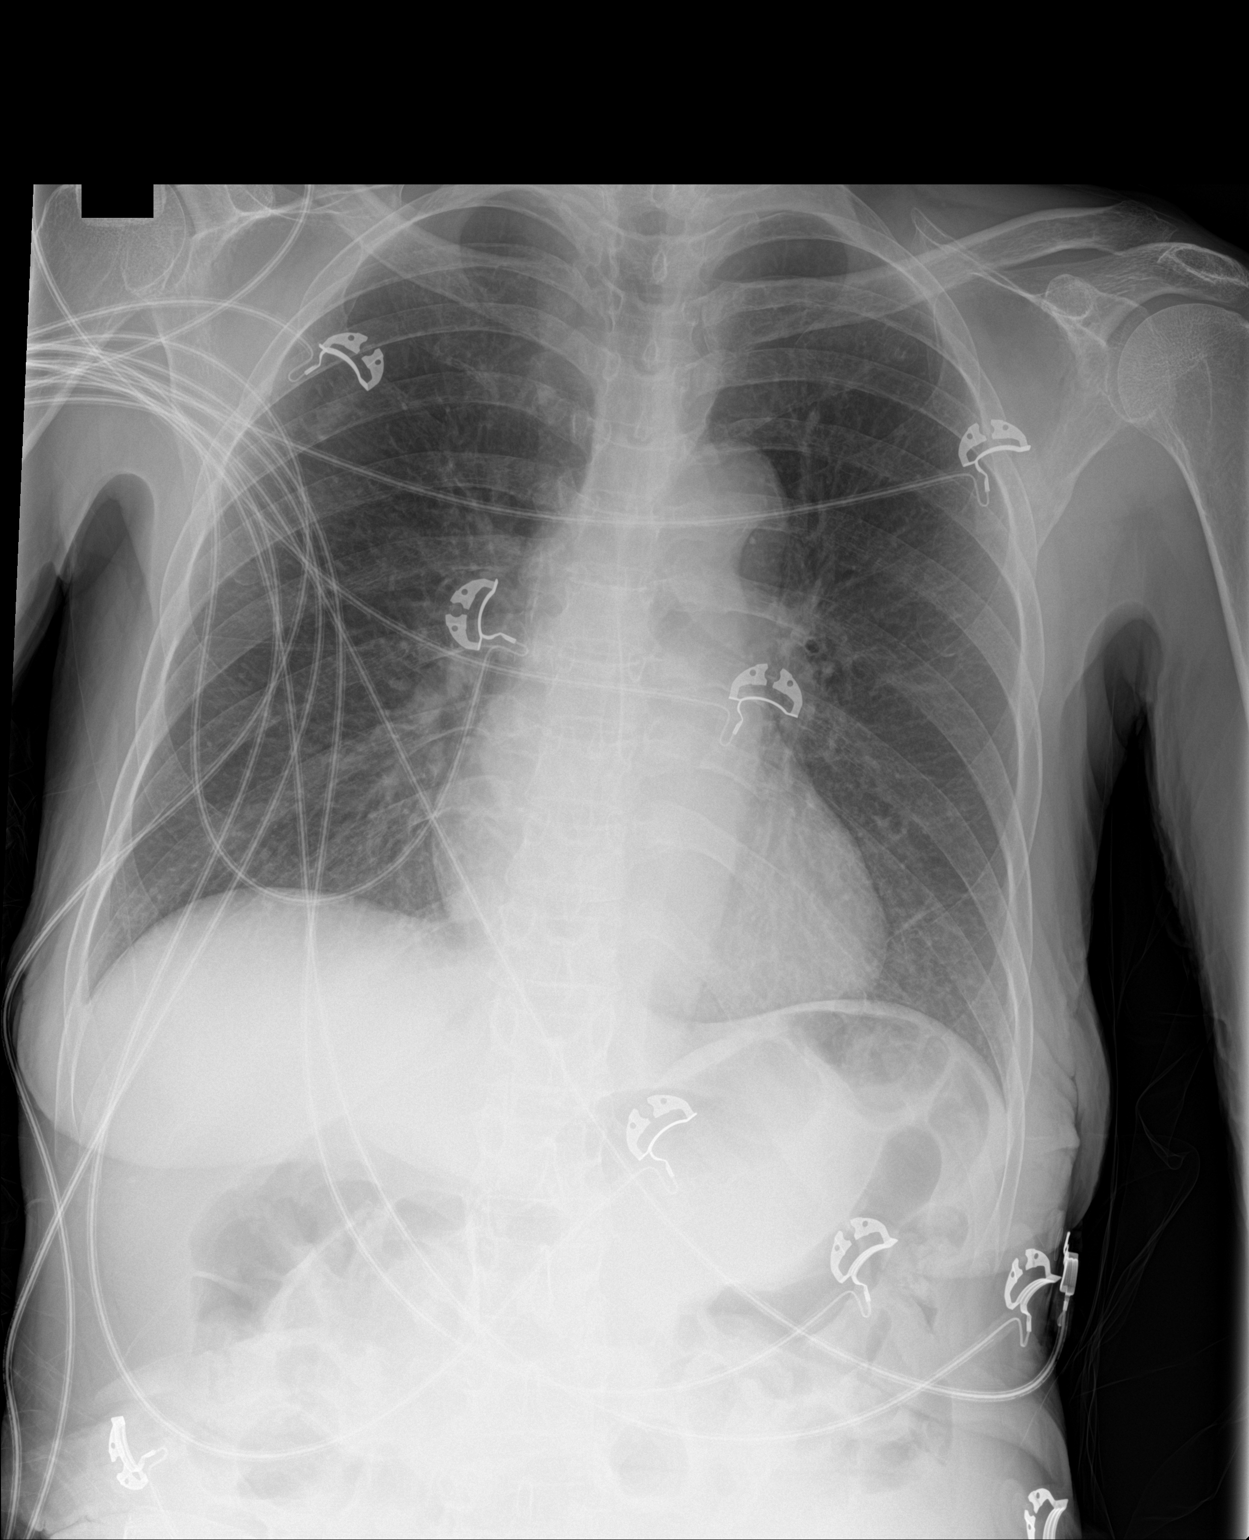

[1 of 1 positions shown; findings below may reference images not displayed]

FINDINGS: The heart size and mediastinal contours are within normal limits.
Both lungs are clear. The visualized skeletal structures are
unremarkable.
IMPRESSION: No active disease.

## 2021-01-09 MED ORDER — SODIUM CHLORIDE 0.9 % IV SOLN: Freq: Once | INTRAVENOUS | Status: AC

## 2021-01-09 MED ORDER — CEFTRIAXONE SODIUM 500 MG IJ SOLR
500.0000 mg | Freq: Once | INTRAMUSCULAR | Status: AC
  Administered 2021-01-09: 500 mg via INTRAMUSCULAR
  Filled 2021-01-09: qty 500

## 2021-01-09 MED ORDER — CEFTRIAXONE SODIUM 500 MG IJ SOLR: 500.0000 mg | Freq: Once | INTRAMUSCULAR | Status: DC

## 2021-01-09 MED ORDER — METRONIDAZOLE 500 MG PO TABS
2000.0000 mg | ORAL_TABLET | Freq: Once | ORAL | Status: AC
  Administered 2021-01-09: 2000 mg via ORAL
  Filled 2021-01-09: qty 4

## 2021-01-09 MED ORDER — DOXYCYCLINE HYCLATE 100 MG PO TABS
100.0000 mg | ORAL_TABLET | Freq: Two times a day (BID) | ORAL | Status: DC
Start: 2021-01-09 — End: 2021-01-15
  Administered 2021-01-09 – 2021-01-15 (×11): 100 mg via ORAL
  Filled 2021-01-09 (×11): qty 1

## 2021-01-09 MED ORDER — DEXTROSE 5 % IV SOLN: 500.0000 mg | Freq: Once | INTRAVENOUS | Status: DC

## 2021-01-09 MED ORDER — CEFTRIAXONE SODIUM 500 MG IJ SOLR
500.0000 mg | Freq: Once | INTRAMUSCULAR | Status: DC
  Filled 2021-01-09: qty 500

## 2021-01-09 NOTE — ED Notes (Signed)
Straight stick attempted x 2, unsuccessful; [place] stick to be attempted by RT for blood work when pt returns from CT.

## 2021-01-09 NOTE — ED Provider Notes (Signed)
10:11 PM Patient transferred from [facility] for evaluation by neurology to determine disposition.  Family and previous provider concern for failure to thrive versus other cause of worsened mental status and overall decline.  Neurology requested transfer for evaluation to discuss further work-up or admission.  Neurology will be called.   Care transferred to oncoming team awaiting for neurology recommendations.   Carla [name], Canary Brim, MD 01/09/21 [phone]

## 2021-01-09 NOTE — ED Notes (Signed)
Patient transported to CT 
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
## 2021-01-09 NOTE — ED Triage Notes (Signed)
Cough for a while. Incontinent of urine and stool. She went to [facility] ER yesterday but left after 12 hours of waiting and not being seen. Daughter states she needs home care.

## 2021-01-09 NOTE — ED Provider Notes (Signed)
Patient care assumed at 2330.  Pt here awaiting neuro eval.   Patient has been evaluated by neurology. Multiple additional recommendations made. Medicine consulted for admission for ongoing workup for altered mental status.   Tilden Fossa, MD 01/10/21 [phone]

## 2021-01-10 ENCOUNTER — Observation Stay (HOSPITAL_COMMUNITY): Payer: Medicaid Other

## 2021-01-10 DIAGNOSIS — R4189 Other symptoms and signs involving cognitive functions and awareness: Secondary | ICD-10-CM

## 2021-01-10 DIAGNOSIS — R531 Weakness: Secondary | ICD-10-CM | POA: Diagnosis not present

## 2021-01-10 DIAGNOSIS — R410 Disorientation, unspecified: Secondary | ICD-10-CM

## 2021-01-10 DIAGNOSIS — F0390 Unspecified dementia without behavioral disturbance: Secondary | ICD-10-CM | POA: Diagnosis present

## 2021-01-10 LAB — SEDIMENTATION RATE: Sed Rate: 31 mm/hr — ABNORMAL HIGH (ref 0–22)

## 2021-01-10 LAB — PORPHOBILINOGEN, RANDOM URINE

## 2021-01-10 LAB — TSH: TSH: 0.505 u[IU]/mL (ref 0.350–4.500)

## 2021-01-10 LAB — TROPONIN I (HIGH SENSITIVITY): Troponin I (High Sensitivity): 5 ng/L (ref <18)

## 2021-01-10 LAB — VITAMIN B12: Vitamin B-12: 563 pg/mL (ref 180–914)

## 2021-01-10 LAB — RPR: RPR Ser Ql: NONREACTIVE

## 2021-01-10 LAB — FOLATE: Folate: 24.5 ng/mL (ref >5.9)

## 2021-01-10 LAB — T4, FREE: Free T4: 0.77 ng/dL (ref 0.61–1.12)

## 2021-01-10 LAB — HIV ANTIBODY (ROUTINE TESTING W REFLEX): HIV Screen 4th Generation wRfx: NONREACTIVE

## 2021-01-10 LAB — LACTIC ACID, PLASMA: Lactic Acid, Venous: 1.4 mmol/L (ref 0.5–1.9)

## 2021-01-10 LAB — C-REACTIVE PROTEIN: CRP: <0.5 mg/dL (ref <1.0)

## 2021-01-10 IMAGING — MR MR CERVICAL SPINE WO/W CM
5 of 8 series · 27 of 48 positions shown · IV contrast (gadavist)
Comparison: None.

CLINICAL DATA: Hyper-reflexia, poor coordination in left upper and
lower extremities. Concern for demyelination

EXAM:
MRI CERVICAL SPINE WITHOUT AND WITH CONTRAST
TECHNIQUE: Multiplanar and multiecho pulse sequences of the cervical spine, to
include the craniocervical junction and cervicothoracic junction,
were obtained without and with intravenous contrast.
CONTRAST:  5mL GADAVIST GADOBUTROL 1 MMOL/ML IV SOLN

[Series 24: T2 · sagittal · 3.0mm · 0.56mm/px · 3 of 12 slices shown (1 of 2)]
[im 1/12]
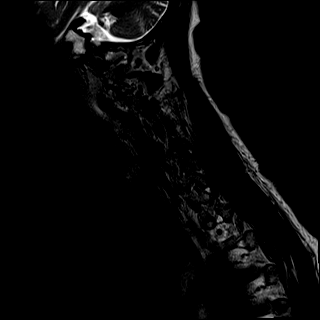
[im 6/12]
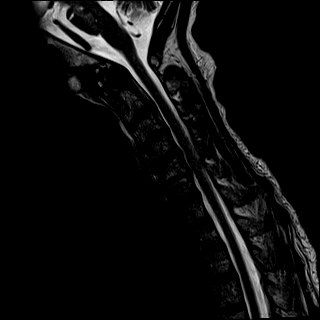
[im 12/12]
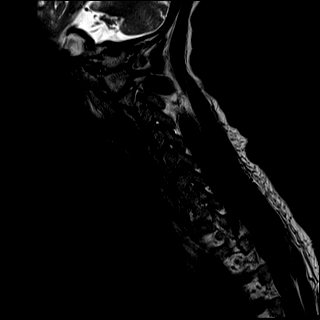

[Series 25: T1 · sagittal · 3.0mm · 0.56mm/px · 3 of 12 slices shown (1 of 2)]
[im 1/12]
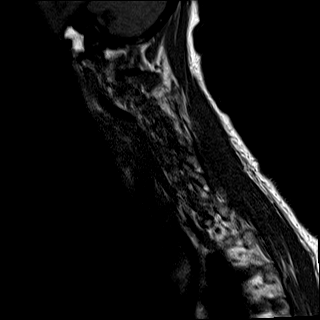
[im 6/12]
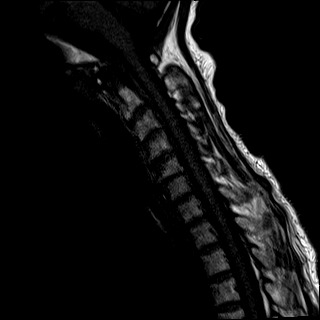
[im 12/12]
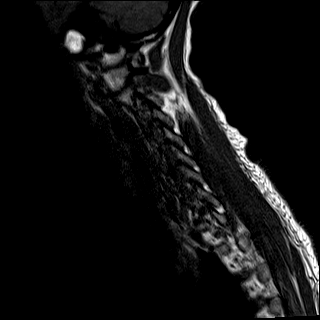

[Series 27: T2 · axial · 3.0mm · 0.59mm/px · z∈[-240,-126]mm · 9 of 40 slices shown (2 of 2)]
[im 1/40]
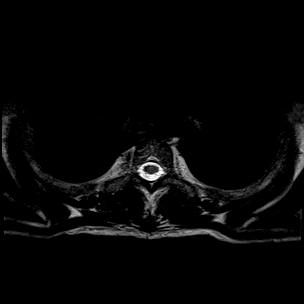
[im 5/40]
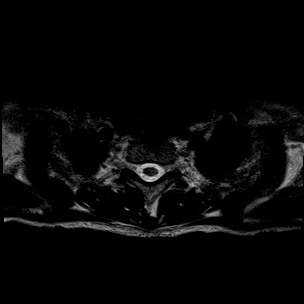
[im 10/40]
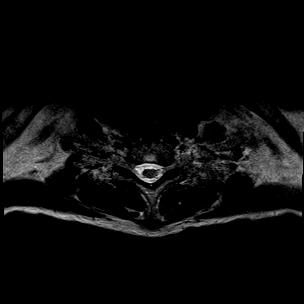
[im 15/40]
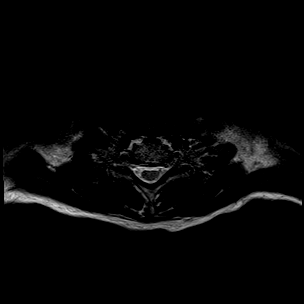
[im 20/40]
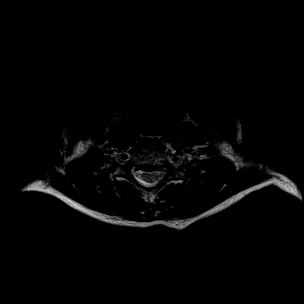
[im 25/40]
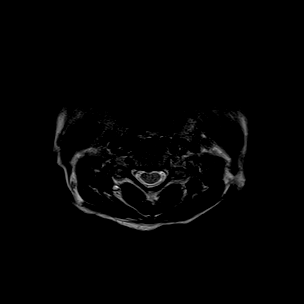
[im 30/40]
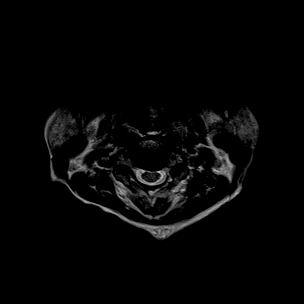
[im 35/40]
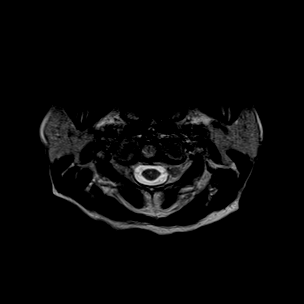
[im 40/40]
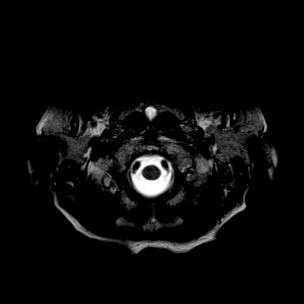

[Series 29: T1 · axial · 3.0mm · 0.35mm/px · z∈[-240,-126]mm · 9 of 40 slices shown (2 of 2)]
[im 1/40]
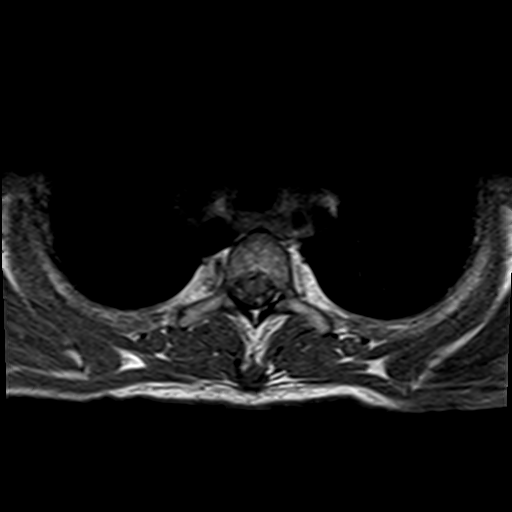
[im 5/40]
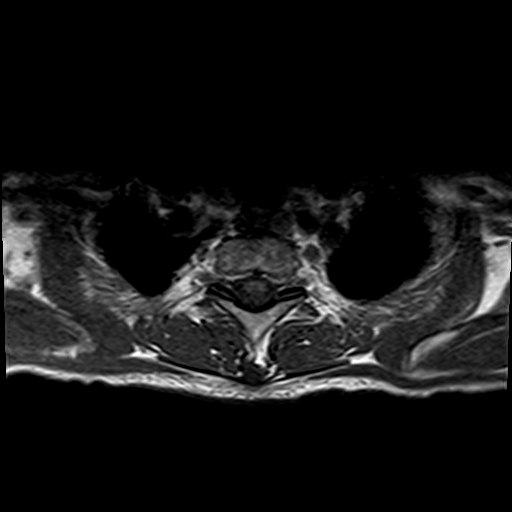
[im 10/40]
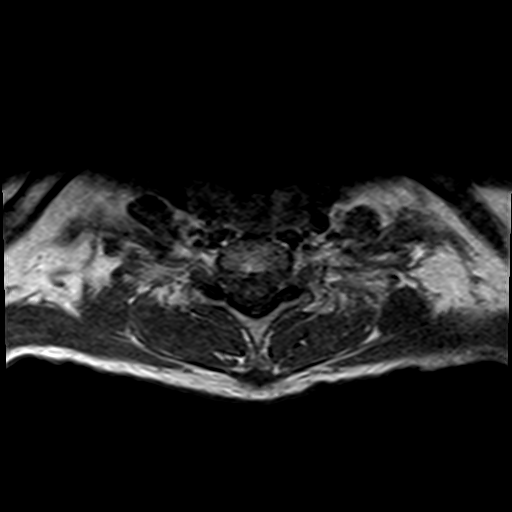
[im 15/40]
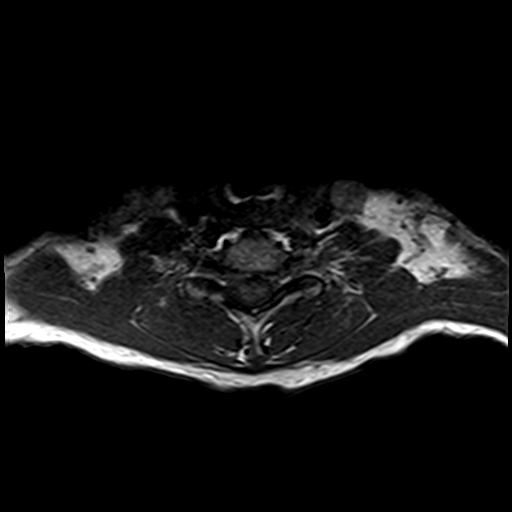
[im 20/40]
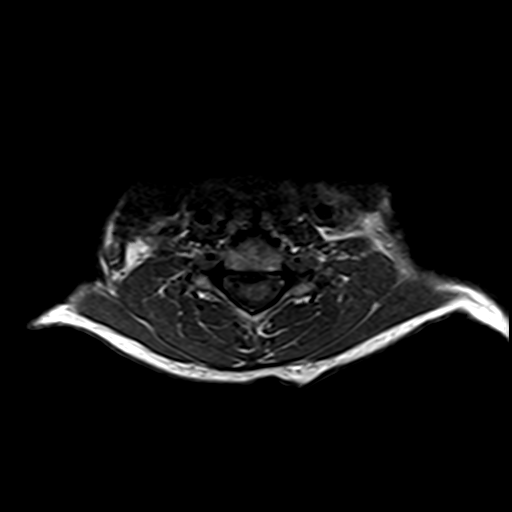
[im 25/40]
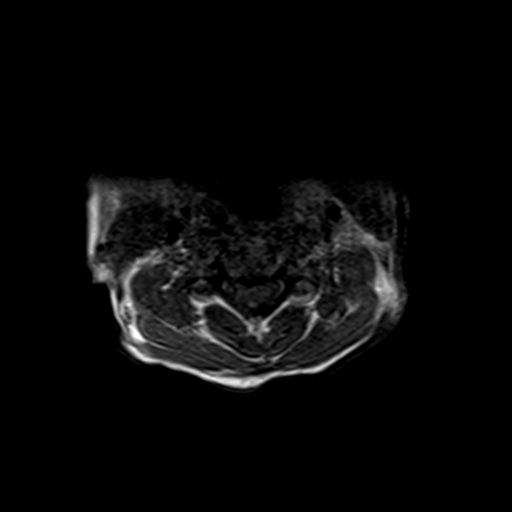
[im 30/40]
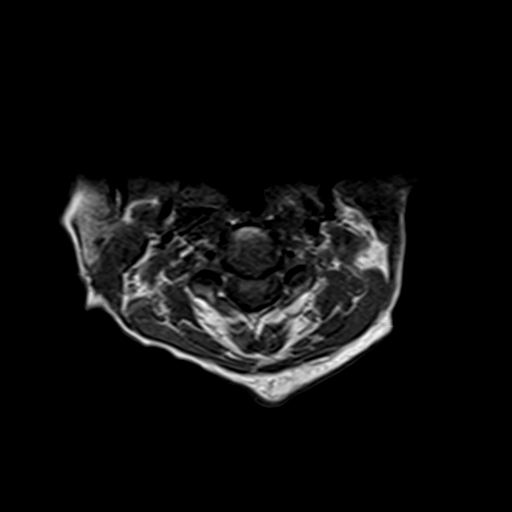
[im 35/40]
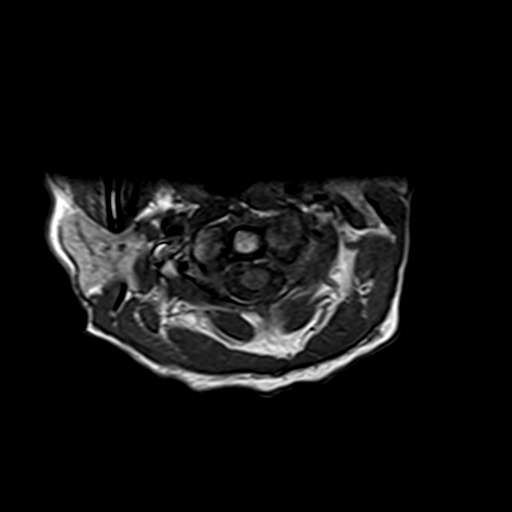
[im 40/40]
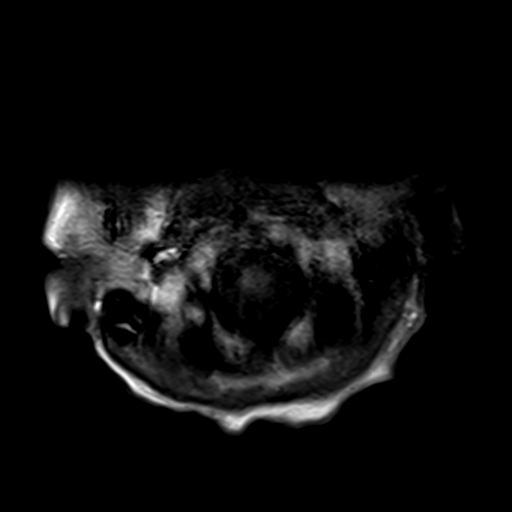

[Series 30: T1 fat-sat post-contrast · sagittal · 3.0mm · 0.35mm/px · 3 of 12 slices shown]
[im 1/12]
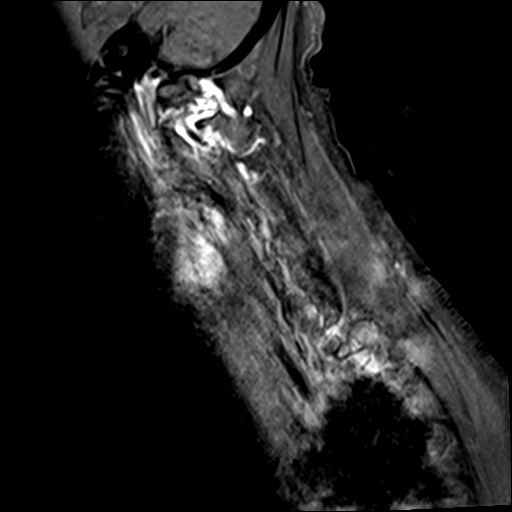
[im 6/12]
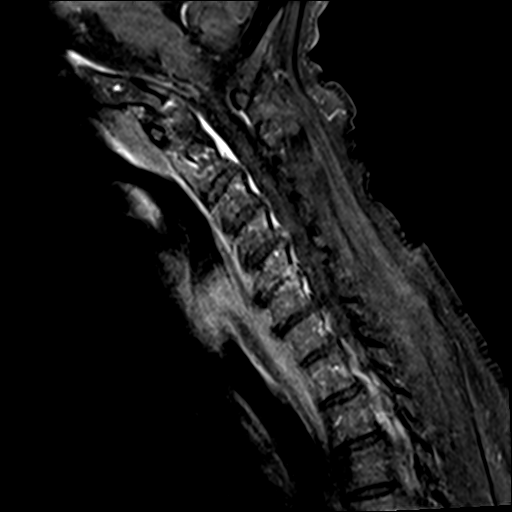
[im 12/12]
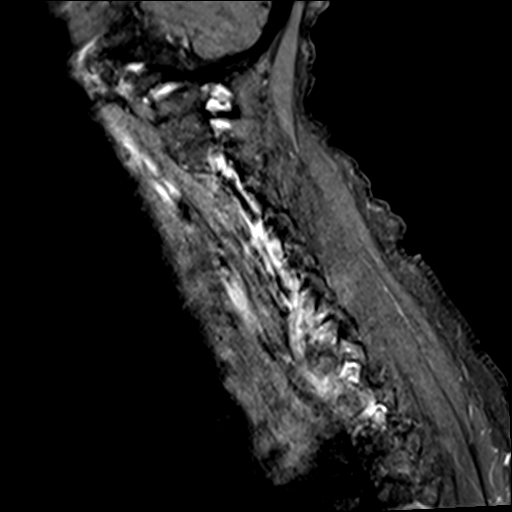

[27 of 48 positions shown; findings below may reference images not displayed]

FINDINGS: Alignment: There is straightening of the normal cervical spine
lordosis. There is no antero or retrolisthesis.

Vertebrae: T1 hyperintensity in the C7 vertebral body likely
reflects an intraosseous hemangioma. There is no suspicious marrow
signal abnormality. Vertebral body heights are preserved.

Cord: There is no cord signal abnormality. Cord morphology is
normal. There is no abnormal enhancement.

Posterior Fossa, vertebral arteries, paraspinal tissues: The
posterior fossa is assessed on the separately dictated brain MRI.
The paraspinal soft tissues are unremarkable. The vertebral artery
flow voids are present.

Disc levels:

There is marked intervertebral disc space narrowing at C5-C6. There
is more mild disc desiccation and narrowing at the other levels.
There is diffuse thickening of the posterior longitudinal ligament
from C2-C3 through C5-C6.

There is trace fluid in the OLOGO dental interval, likely
degenerative in nature.

C2-C3: There is a mild broad-based disc bulge without significant
spinal canal or neural foraminal stenosis.

C3-C4: There is a mild posterior disc osteophyte complex with a
small superimposed protrusion, thickening of the posterior
longitudinal ligament, and uncovertebral and facet arthropathy
resulting in moderate bilateral neural foraminal stenosis and mild
spinal canal stenosis with partial effacement of the ventral thecal
sac.

C4-C5: There is a posterior disc osteophyte complex with a small
broad-based protrusion, thickening of the posterior longitudinal
ligament, and uncovertebral and facet arthropathy resulting in mild
spinal canal stenosis with partial effacement of the ventral thecal
sac and moderate to severe bilateral neural foraminal stenosis.

C5-C6: There is a posterior disc osteophyte complex, thickening of
the posterior longitudinal ligament and ligamentum flavum, and
uncovertebral and facet arthropathy resulting in moderate spinal
canal stenosis with effacement of the ventral and dorsal thecal sac
and severe bilateral neural foraminal stenosis.

C6-C7: There is a mild posterior disc osteophyte complex and
uncovertebral and facet arthropathy resulting in moderate left and
mild right neural foraminal stenosis without significant spinal
canal stenosis

C7-T1: Degenerative endplate change and bilateral facet arthropathy
resulting moderate left and no significant right neural foraminal
stenosis and no significant spinal canal stenosis.

Other: A T2 hyperintense lesion the nasopharynx likely reflects a
retention cyst.
IMPRESSION: 1. No evidence of demyelinating disease in the cervical spine.
2. Multilevel degenerative changes detailed above, most advanced at
C5-C6 where there is moderate spinal canal stenosis and severe
bilateral neural foraminal stenosis. Varying degrees of spinal canal
and neural foraminal stenosis at the remaining levels are detailed
above.

## 2021-01-10 IMAGING — MR MR HEAD WO/W CM
14 of 16 series · 40 of 48 positions shown · IV contrast (gadavist)
Comparison: CT head [DATE]

CLINICAL DATA: Progressive dementia, cognitive decline. Concern for
vascular dementia, multiple sclerosis

EXAM:
MRI HEAD WITHOUT AND WITH CONTRAST
TECHNIQUE: Multiplanar, multiecho pulse sequences of the brain and surrounding
structures were obtained without and with intravenous contrast.
CONTRAST:  5mL GADAVIST GADOBUTROL 1 MMOL/ML IV SOLN

[Series 5: DWI · axial · 3.0mm · 0.88mm/px · z∈[-159,-47]mm · 6 of 92 slices shown (1 of 4)]
[im 1/92]
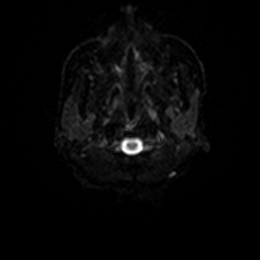
[im 19/92]
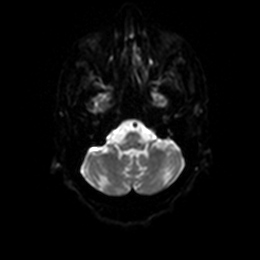
[im 37/92]
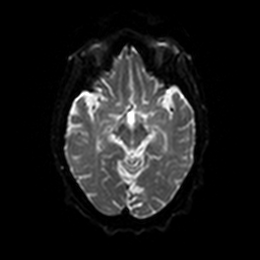
[im 55/92]
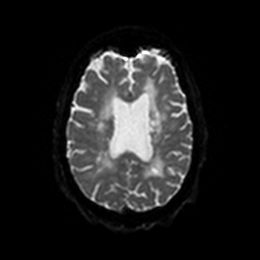
[im 73/92]
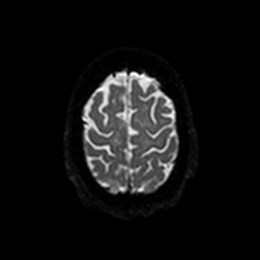
[im 92/92]
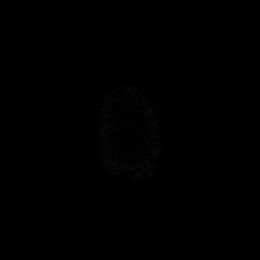

[Series 6: DWI · axial · 3.0mm · 0.88mm/px · z∈[-159,-47]mm · 3 of 46 slices shown (2 of 4)]
[im 1/46]
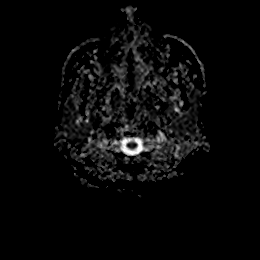
[im 23/46]
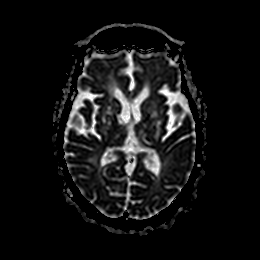
[im 46/46]
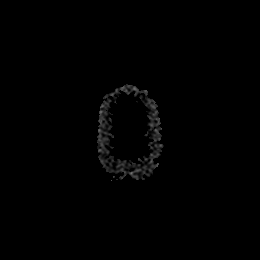

[Series 7: DWI · coronal · 4.0mm · 0.88mm/px · 5 of 64 slices shown (3 of 4)]
[im 1/64]
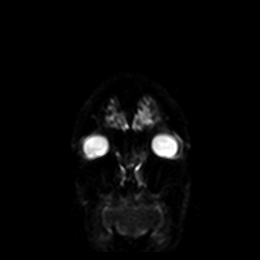
[im 16/64]
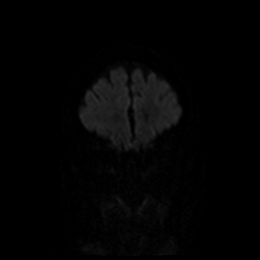
[im 32/64]
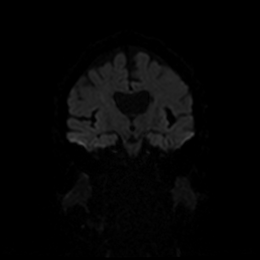
[im 48/64]
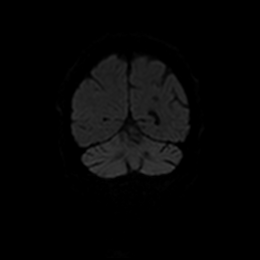
[im 64/64]
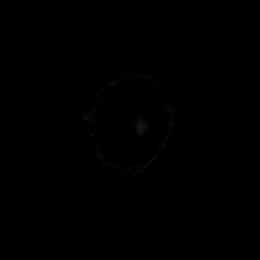

[Series 8: DWI · coronal · 4.0mm · 0.88mm/px · 2 of 32 slices shown (4 of 4)]
[im 1/32]
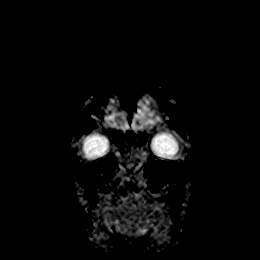
[im 32/32]
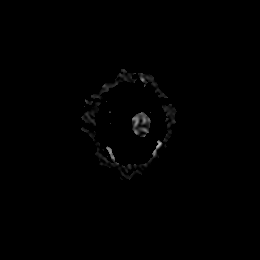

[Series 9: T1 · sagittal · 5.0mm · 0.75mm/px · 1 of 20 slices shown]
[im 1/20]
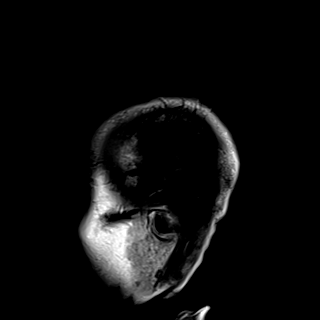

[Series 10: T2 · axial · 5.0mm · 0.72mm/px · z∈[-154,-50]mm · 2 of 22 slices shown]
[im 1/22]
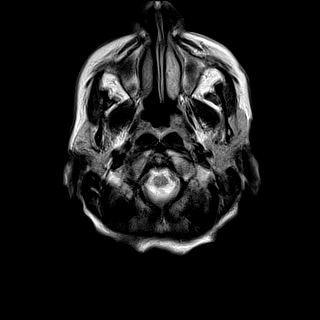
[im 22/22]
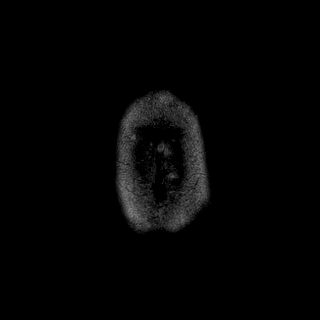

[Series 12: mag_images · axial · 3.0mm · 0.90mm/px · z∈[-164,-47]mm · 4 of 48 slices shown]
[im 1/48]
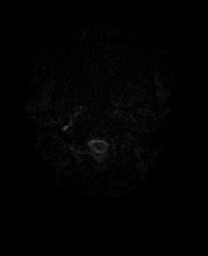
[im 16/48]
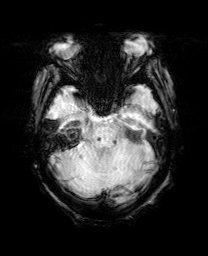
[im 32/48]
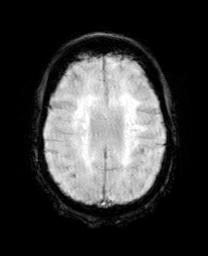
[im 48/48]
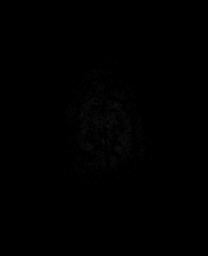

[Series 13: pha_images · axial · 3.0mm · 0.90mm/px · z∈[-164,-55]mm · 3 of 45 slices shown]
[im 1/45]
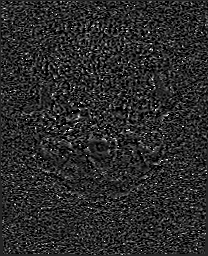
[im 23/45]
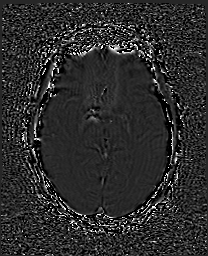
[im 45/45]
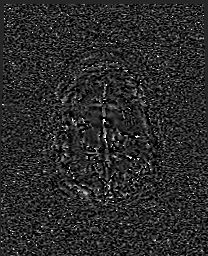

[Series 14: swi_images · axial · 3.0mm · 0.90mm/px · z∈[-164,-47]mm · 4 of 48 slices shown]
[im 1/48]
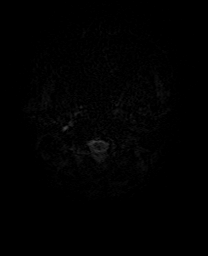
[im 16/48]
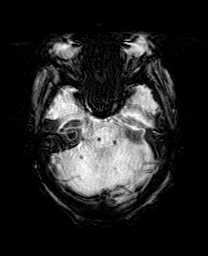
[im 32/48]
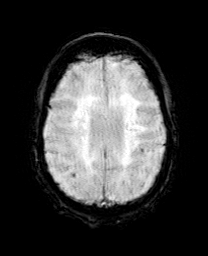
[im 48/48]
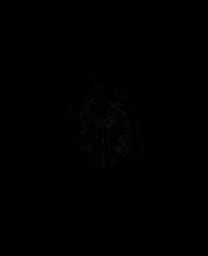

[Series 15: mip_images(sw) · axial · 24.0mm · 0.90mm/px · z∈[-155,-56]mm · 3 of 41 slices shown]
[im 1/41]
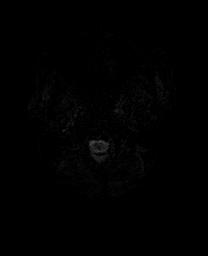
[im 21/41]
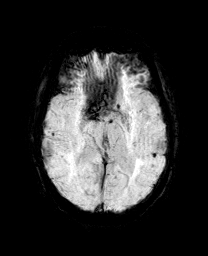
[im 41/41]
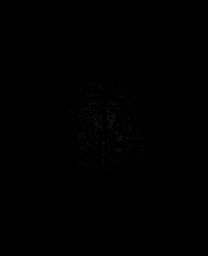

[Series 19: FLAIR · axial · 5.0mm · 0.45mm/px · z∈[-154,-49]mm · 2 of 22 slices shown]
[im 1/22]
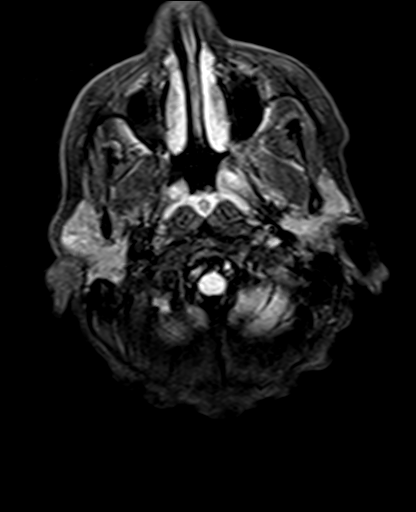
[im 22/22]
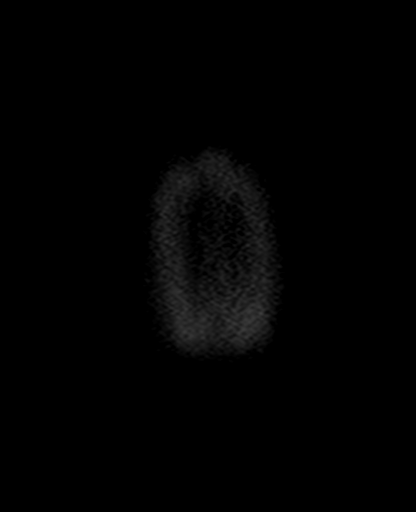

[Series 32: T2 post-contrast · coronal · 5.0mm · 0.72mm/px · 2 of 26 slices shown]
[im 1/26]
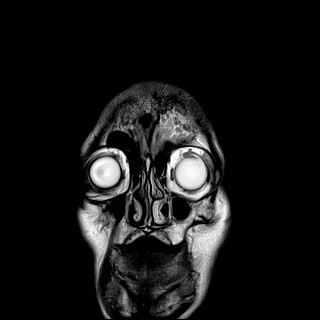
[im 26/26]
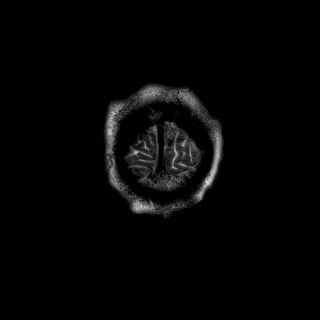

[Series 34: T1 post-contrast · coronal · 5.0mm · 0.34mm/px · 2 of 26 slices shown (1 of 2)]
[im 1/26]
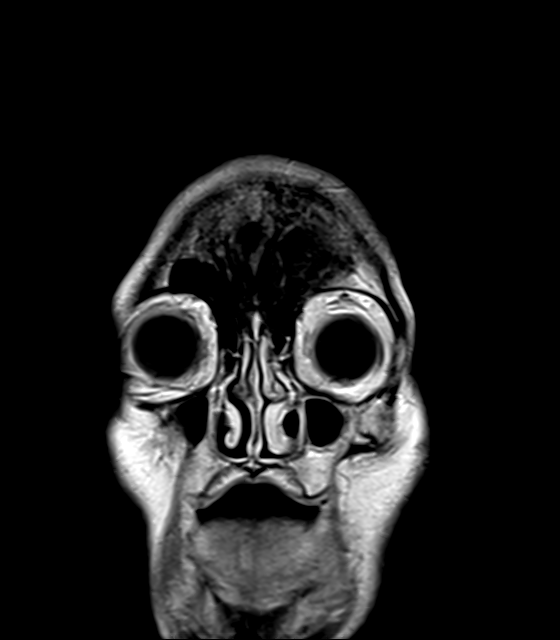
[im 26/26]
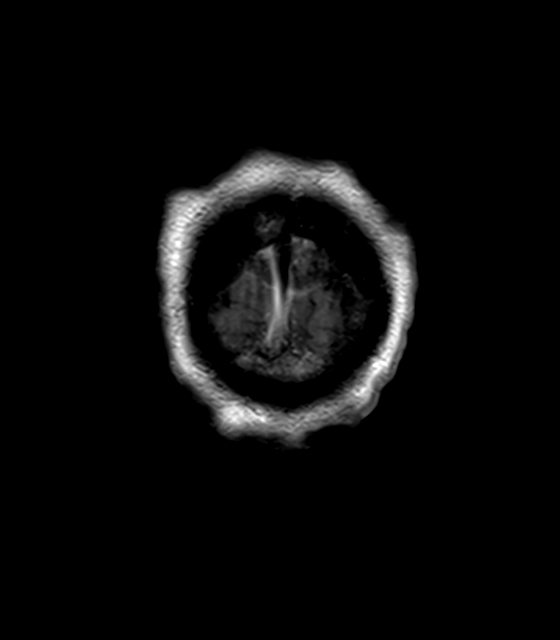

[Series 35: T1 post-contrast · sagittal · 5.0mm · 0.72mm/px · 1 of 20 slices shown (2 of 2)]
[im 1/20]
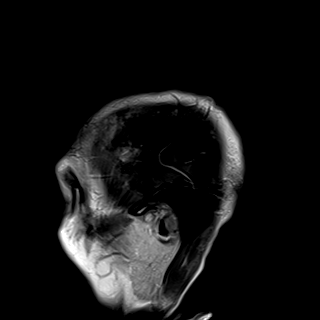

[40 of 48 positions shown; findings below may reference images not displayed]

FINDINGS: Brain: There is no evidence of acute intracranial hemorrhage,
extra-axial fluid collection, or acute infarct.

There is extensive confluent FLAIR signal abnormality throughout the
subcortical and periventricular white matter

There are remote lacunar infarcts in the bilateral basal ganglia,
left corona radiata, pons, and bilateral cerebellar hemispheres.

There is moderate global parenchymal volume loss, accelerated for
age. There is no disproportionate hippocampal atrophy. The
ventricles are prominent. There is no solid mass lesion.

There are scattered punctate chronic microhemorrhages, some of which
are located in the peripheral brain parenchyma while others are
located in the basal ganglia, brainstem, and cerebellar hemispheres.

There is no abnormal enhancement.

Vascular: Normal flow voids.

Skull and upper cervical spine: Normal marrow signal.

Sinuses/Orbits: The imaged paranasal sinuses are clear. The globes
and orbits are unremarkable.

Other: A nonenhancing T2 hyperintense lesion in the nasopharynx
likely reflects a mucous retention cyst.
IMPRESSION: 1. Extensive confluent FLAIR signal abnormality throughout the
supratentorial white matter and brainstem. The appearance is
nonspecific but is favored to reflect advanced chronic white matter
microangiopathy (accelerated for age) given the presence of multiple
lacunar infarcts in the bilateral basal ganglia, left corona
radiata, brainstem, and cerebellar hemispheres. Multiple sclerosis
cannot be entirely excluded but is considered less likely.
2. Moderate parenchymal volume loss without discernible lobar
predominance, also accelerated for age. There is no disproportionate
hippocampal atrophy.
3. No abnormal enhancement.
4. Scattered punctate chronic microhemorrhages, some of which are
peripheral while others are central. These may be hypertensive in
etiology; cerebral amyloid angiopathy or a combination of both
etiologies cannot be excluded.

## 2021-01-10 MED ORDER — SODIUM CHLORIDE 0.9 % IV SOLN: Freq: Once | INTRAVENOUS | Status: AC

## 2021-01-10 MED ORDER — GADOBUTROL 1 MMOL/ML IV SOLN
5.0000 mL | Freq: Once | INTRAVENOUS | Status: AC | PRN
  Administered 2021-01-10: 5 mL via INTRAVENOUS

## 2021-01-10 MED ORDER — HALOPERIDOL LACTATE 5 MG/ML IJ SOLN
5.0000 mg | Freq: Once | INTRAMUSCULAR | Status: AC
  Administered 2021-01-10: 5 mg via INTRAMUSCULAR
  Filled 2021-01-10: qty 1

## 2021-01-10 MED ORDER — ACETAMINOPHEN 325 MG PO TABS: 650.0000 mg | ORAL_TABLET | Freq: Four times a day (QID) | ORAL | Status: DC | PRN

## 2021-01-10 MED ORDER — ONDANSETRON HCL 4 MG/2ML IJ SOLN: 4.0000 mg | Freq: Four times a day (QID) | INTRAMUSCULAR | Status: DC | PRN

## 2021-01-10 MED ORDER — IPRATROPIUM-ALBUTEROL 0.5-2.5 (3) MG/3ML IN SOLN: 3.0000 mL | Freq: Four times a day (QID) | RESPIRATORY_TRACT | Status: DC | PRN

## 2021-01-10 MED ORDER — ACETAMINOPHEN 650 MG RE SUPP: 650.0000 mg | Freq: Four times a day (QID) | RECTAL | Status: DC | PRN

## 2021-01-10 MED ORDER — ONDANSETRON HCL 4 MG PO TABS: 4.0000 mg | ORAL_TABLET | Freq: Four times a day (QID) | ORAL | Status: DC | PRN

## 2021-01-10 MED ORDER — MOMETASONE FURO-FORMOTEROL FUM 100-5 MCG/ACT IN AERO
2.0000 | INHALATION_SPRAY | Freq: Two times a day (BID) | RESPIRATORY_TRACT | Status: DC
  Administered 2021-01-11 – 2021-01-15 (×9): 2 via RESPIRATORY_TRACT
  Filled 2021-01-10 (×2): qty 8.8

## 2021-01-10 MED ORDER — ENOXAPARIN SODIUM 40 MG/0.4ML IJ SOSY
40.0000 mg | PREFILLED_SYRINGE | INTRAMUSCULAR | Status: DC
  Administered 2021-01-10 – 2021-01-15 (×6): 40 mg via SUBCUTANEOUS
  Filled 2021-01-10 (×6): qty 0.4
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
## 2021-01-10 NOTE — ED Notes (Signed)
RN updated pt's daughter, [name] [phone]

## 2021-01-10 NOTE — ED Notes (Signed)
Admitting paged for diet order. 
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
## 2021-01-10 NOTE — ED Notes (Signed)
Received pt, sleeping, no acute distress.

## 2021-01-10 NOTE — ED Notes (Signed)
Patient transported to MRI 
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
## 2021-01-10 NOTE — ED Notes (Signed)
Entered the room found pt had removed the monitor, and removed bilat PIV at this time. Visitor in room with pt

## 2021-01-10 NOTE — ED Notes (Signed)
Lunch tray ordered 

## 2021-01-11 DIAGNOSIS — A599 Trichomoniasis, unspecified: Secondary | ICD-10-CM | POA: Diagnosis not present

## 2021-01-11 DIAGNOSIS — F0390 Unspecified dementia without behavioral disturbance: Secondary | ICD-10-CM | POA: Diagnosis not present

## 2021-01-11 DIAGNOSIS — R4189 Other symptoms and signs involving cognitive functions and awareness: Secondary | ICD-10-CM | POA: Diagnosis not present

## 2021-01-11 DIAGNOSIS — R531 Weakness: Secondary | ICD-10-CM | POA: Diagnosis not present

## 2021-01-11 LAB — CBC WITH DIFFERENTIAL/PLATELET
Abs Immature Granulocytes: 0.01 10*3/uL (ref 0.00–0.07)
Basophils Absolute: 0 10*3/uL (ref 0.0–0.1)
Basophils Relative: 1 %
Eosinophils Absolute: 0.1 10*3/uL (ref 0.0–0.5)
Eosinophils Relative: 1 %
HCT: 36.1 % (ref 36.0–46.0)
Hemoglobin: 11.8 g/dL — ABNORMAL LOW (ref 12.0–15.0)
Immature Granulocytes: 0 %
Lymphocytes Relative: 44 %
Lymphs Abs: 1.8 10*3/uL (ref 0.7–4.0)
MCH: 32.3 pg (ref 26.0–34.0)
MCHC: 32.7 g/dL (ref 30.0–36.0)
MCV: 98.9 fL (ref 80.0–100.0)
Monocytes Absolute: 0.4 10*3/uL (ref 0.1–1.0)
Monocytes Relative: 10 %
Neutro Abs: 1.8 10*3/uL (ref 1.7–7.7)
Neutrophils Relative %: 44 %
Platelets: 214 10*3/uL (ref 150–400)
RBC: 3.65 MIL/uL — ABNORMAL LOW (ref 3.87–5.11)
RDW: 13 % (ref 11.5–15.5)
WBC: 4.1 10*3/uL (ref 4.0–10.5)
nRBC: 0 % (ref 0.0–0.2)

## 2021-01-11 LAB — BASIC METABOLIC PANEL
Anion gap: 6 (ref 5–15)
BUN: 8 mg/dL (ref 6–20)
CO2: 27 mmol/L (ref 22–32)
Calcium: 9.3 mg/dL (ref 8.9–10.3)
Chloride: 107 mmol/L (ref 98–111)
Creatinine, Ser: 0.69 mg/dL (ref 0.44–1.00)
GFR, Estimated: >60 mL/min (ref >60)
Glucose, Bld: 97 mg/dL (ref 70–99)
Potassium: 3.8 mmol/L (ref 3.5–5.1)
Sodium: 140 mmol/L (ref 135–145)

## 2021-01-11 LAB — URINE CULTURE

## 2021-01-11 LAB — RHEUMATOID FACTOR: Rheumatoid fact SerPl-aCnc: <10 IU/mL (ref <14.0)
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
## 2021-01-11 NOTE — Progress Notes (Signed)
Neurology Progress Note  S: Complaining of mucus with congestion in head. Clear mucous.  Says she feels fine.   O: Current vital signs: BP 124/80 (BP Location: Left Arm)   Pulse (!) 50   Temp (!) 97.5 F (36.4 C) (Axillary)   Resp 15   Ht 5\' 1"  (1.549 m)   Wt 50 kg   SpO2 99%   BMI 20.83 kg/m  Vital signs in last 24 hours: Temp:  [97.5 F (36.4 C)-98.6 F (37 C)] 97.5 F (36.4 C) (10/16 0747) Pulse Rate:  [50-79] 50 (10/16 0747) Resp:  [12-24] 15 (10/16 0747) BP: (105-160)/(66-112) 124/80 (10/16 0747) SpO2:  [91 %-99 %] 99 % (10/16 0747)  GENERAL: Chronically ill appearing frail cachetic female in NAD. Awake.  HEENT: Normocephalic and atraumatic. LUNGS: Normal respiratory effort.  CV: RRR on tele.  Ext: warm.  NEURO:  Mental Status: Alert  and oriented to place, city, state. Disoriented to age, month, day, date, or age. Follows commands.   Speech/Language: speech is with minor dysarthria vs. Edentulous. out aphasia or dysarthria. Naming intact. Fluency and comprehension intact.   Cranial Nerves:  Eyes lids elevate symmetrically. 09-27-2002. No drift to UEs or LEs. Hearing intact to voice. Sensation intact and symmetrical to face and all extremities.  Motor:  RUE: 5/5  RLE: thigh 4+   plantar flexion 5/5    dorsiflexion 5/5 LUE: 5/5 LLE: thigh 4+  plantar flexion 5/5  dorsiflexion  5/5 Tone: is normal and bulk is decreased. BMI 20.83.  Sensation- Intact to light touch bilaterally. Extinction absent to light touch to DSS.    Coordination: No drift UEs or LEs.   Medications  Current Facility-Administered Medications:    acetaminophen (TYLENOL) tablet 650 mg, 650 mg, Oral, Q6H PRN **OR** acetaminophen (TYLENOL) suppository 650 mg, 650 mg, Rectal, Q6H PRN, Control and instrumentation engineer, Jared M, DO   doxycycline (VIBRA-TABS) tablet 100 mg, 100 mg, Oral, Q12H, Curatolo, Adam, DO, 100 mg at 01/11/21 0937   enoxaparin (LOVENOX) injection 40 mg, 40 mg, Subcutaneous, Q24H, 01/13/21, Jared  M, DO, 40 mg at 01/11/21 01/13/21   ipratropium-albuterol (DUONEB) 0.5-2.5 (3) MG/3ML nebulizer solution 3 mL, 3 mL, Nebulization, Q6H PRN, [id number], Sharolyn Douglas, MD   mometasone-formoterol (DULERA) 100-5 MCG/ACT inhaler 2 puff, 2 puff, Inhalation, BID, Monica Martinez, MD, 2 puff at 01/11/21 0824   ondansetron (ZOFRAN) tablet 4 mg, 4 mg, Oral, Q6H PRN **OR** ondansetron (ZOFRAN) injection 4 mg, 4 mg, Intravenous, Q6H PRN, 01/13/21, DO  Pertinent Labs RPR negative, HIV Non reactive, B12 normal.   Imaging  CT Head Chronic atrophic and ischemic changes without acute abnormality.  MRI Brain  -Extensive confluent FLAIR signal abnormality throughout the supratentorial white matter and brainstem. The appearance is nonspecific but is favored to reflect advanced chronic white matter microangiopathy (accelerated for age) given the presence of multiple lacunar infarcts in the bilateral basal ganglia, left corona radiata, brainstem, and cerebellar hemispheres. Multiple sclerosis cannot be entirely excluded but is considered less likely. -Moderate parenchymal volume loss without discernible lobar predominance, also accelerated for age. There is no disproportionate hippocampal atrophy. -No abnormal enhancement. -Scattered punctate chronic microhemorrhages, some of which are peripheral while others are central. These may be hypertensive in etiology; cerebral amyloid angiopathy or a combination of both etiologies cannot be excluded.  Assessment: 55 yo female with cognitive decline, incontinence, poor po intake, gait disturbance. She was brought in by familly due to not being able to care for her at home. Concern for  underlying progressive dementia and also poorly controlled DM and HTN causing vascular dementia. Workup for autoimmune disease is also in progress. MRI brain and Cspine did not show demyelinating disease. Her decline is likely dementia.   Recommendations/Plan: -Continue to f/up  labs and call neuro if positive finding. (Niacin,  ANA with IFA, DSCNA, ACE, RF.) -Neurology will be available prn for questions.   Pt seen by Jimmye Norman, MSN, APN-BC/Nurse Practitioner/Neuro Pager: [phone]
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
## 2021-01-11 NOTE — TOC Initial Note (Signed)
Transition of Care [facility]) - Initial/Assessment Note    Patient Details  Name: Carla [name] MRN: [id number] Date of Birth: May 05, 1965  [facility]) CM/SW Contact:    Baldemar Lenis, LCSW Phone Number: 01/11/2021, 12:49 PM  Clinical Narrative:          CSW spoke with patient's daughter, Carla [name], to discuss disposition. Carla [name] indicated that the patient has nowhere to go where they can care for her, she can't return to her boyfriend's home because they can't care for her anymore. Patient will need long term facility placement. CSW discussed with daughter how patient's Medicaid will need to transition to long term Medicaid, and we'll try to locate a bed closer to home but CSW is unaware of any Medicaid beds at this time. Carla [name] indicated understanding. CSW faxed out referral, will follow.      Expected Discharge Plan: Skilled Nursing Facility Barriers to Discharge: Continued Medical Work up, English as a second language teacher, Inadequate or no insurance, Unsafe home situation   Patient Goals and CMS Choice Patient states their goals for this hospitalization and ongoing recovery are:: patient unable to participate in goal setting, confused CMS Medicare.gov Compare Post Acute Care list provided to:: Patient Represenative (must comment) Choice offered to / list presented to : Adult Children  Expected Discharge Plan and Services Expected Discharge Plan: Skilled Nursing Facility     Post Acute Care Choice: Skilled Nursing Facility Living arrangements for the past 2 months: Apartment                                      Prior Living Arrangements/Services Living arrangements for the past 2 months: Apartment Lives with:: Significant Other Patient language and need for interpreter reviewed:: No Do you feel safe going back to the place where you live?: Yes      Need for Family Participation in Patient Care: Yes (Comment) Care giver support system in place?: No  (comment) Current home services: DME Criminal Activity/Legal Involvement Pertinent to Current Situation/Hospitalization: No - Comment as needed  Activities of Daily Living      Permission Sought/Granted Permission sought to share information with : Facility Medical sales representative, Family Supports Permission granted to share information with : Yes, Verbal Permission Granted  Share Information with NAME: Carla [name]  Permission granted to share info w AGENCY: SNF  Permission granted to share info w Relationship: Daughter     Emotional Assessment   Attitude/Demeanor/Rapport: Unable to Assess Affect (typically observed): Unable to Assess Orientation: : Oriented to Self, Oriented to Place, Oriented to  Time, Oriented to Situation Alcohol / Substance Use: Tobacco Use Psych Involvement: No (comment)  Admission diagnosis:  Delirium [R41.0] Weakness [R53.1] Cognitive changes [R41.89] Trichomonal cervicitis [A59.09] Patient Active Problem List   Diagnosis Date Noted   Cognitive changes 01/10/2021   Influenza A 05/10/2017   COPD with acute exacerbation (HCC) 05/09/2017   Sepsis (HCC) 05/09/2017   Asthma, chronic, unspecified asthma severity, with acute exacerbation 05/09/2017   Asthma exacerbation 05/09/2017   PCP:  Patient, No Pcp Per (Inactive) Pharmacy:   [id number] [place], [place] - [address] [place] [address] Phone: [phone] Fax: [phone]     Social Determinants of Health (SDOH) Interventions    Readmission Risk Interventions No flowsheet data found.
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
## 2021-01-11 NOTE — [place] FL2 (Signed)
[place] MEDICA[place]2 LEVEL OF CARE SCREENING TOOL     IDENTIFICATION  Patient Name: Carla [name] Birthdate: Feb 28, 1966 Sex: female Admission Date (Current Location): 01/09/2021  [facility] and [place] Number:  Producer, television/film/video and Address:  [facility] [place], [address] [place], [place] [address]      Provider Number: [id number]  Attending Physician Name and Address:  Briant Cedar, MD  Relative Name and Phone Number:       Current Level of Care: Hospital Recommended Level of Care: Skilled Nursing Facility Prior Approval Number:    Date Approved/Denied:   PASRR Number: [id number]  Discharge Plan: SNF    Current Diagnoses: Patient Active Problem List   Diagnosis Date Noted   Cognitive changes 01/10/2021   Influenza A 05/10/2017   COPD with acute exacerbation (HCC) 05/09/2017   Sepsis (HCC) 05/09/2017   Asthma, chronic, unspecified asthma severity, with acute exacerbation 05/09/2017   Asthma exacerbation 05/09/2017    Orientation RESPIRATION BLADDER Height & Weight     Self, Time, Situation, Place  Normal Incontinent Weight: 110 lb 3.7 oz (50 kg) Height:  5\' 1"  (154.9 cm)  BEHAVIORAL SYMPTOMS/MOOD NEUROLOGICAL BOWEL NUTRITION STATUS      Incontinent Diet (see DC summary)  AMBULATORY STATUS COMMUNICATION OF NEEDS Skin   Limited Assist Verbally Normal                       Personal Care Assistance Level of Assistance  Bathing, Feeding, Dressing Bathing Assistance: Limited assistance Feeding assistance: Independent Dressing Assistance: Limited assistance     Functional Limitations Info  Speech     Speech Info: Impaired (dysarthria)    SPECIAL CARE FACTORS FREQUENCY  PT (By licensed PT), OT (By licensed OT)     PT Frequency: 5x/wk OT Frequency: 5x/wk            Contractures Contractures Info: Not present    Additional Factors Info  Code Status, Allergies Code Status Info: Full Allergies Info: NKA            Current Medications (01/11/2021):  This is the current hospital active medication list Current Facility-Administered Medications  Medication Dose Route Frequency Provider Last Rate Last Admin   acetaminophen (TYLENOL) tablet 650 mg  650 mg Oral Q6H PRN 01/13/2021, DO       Or   acetaminophen (TYLENOL) suppository 650 mg  650 mg Rectal Q6H PRN Hillary Bow, DO       doxycycline (VIBRA-TABS) tablet 100 mg  100 mg Oral Q12H Curatolo, Adam, DO   100 mg at 01/11/21 0937   enoxaparin (LOVENOX) injection 40 mg  40 mg Subcutaneous Q24H 01/13/21 M, DO   40 mg at 01/11/21 01/13/21   ipratropium-albuterol (DUONEB) 0.5-2.5 (3) MG/3ML nebulizer solution 3 mL  3 mL Nebulization Q6H PRN 3810, MD       mometasone-formoterol [facility]) 100-5 MCG/ACT inhaler 2 puff  2 puff Inhalation BID [facility], MD   2 puff at 01/11/21 0824   ondansetron (ZOFRAN) tablet 4 mg  4 mg Oral Q6H PRN 01/13/21, DO       Or   ondansetron [facility]) injection 4 mg  4 mg Intravenous Q6H PRN [facility], DO         Discharge Medications: Please see discharge summary for a list of discharge medications.  Relevant Imaging Results:  Relevant Lab Results:   Additional Information SS#: Hillary Bow  Baldemar Lenis, LCSW

## 2021-01-12 DIAGNOSIS — F0150 Vascular dementia without behavioral disturbance: Secondary | ICD-10-CM | POA: Diagnosis present

## 2021-01-12 DIAGNOSIS — R627 Adult failure to thrive: Secondary | ICD-10-CM | POA: Diagnosis present

## 2021-01-12 DIAGNOSIS — F0390 Unspecified dementia without behavioral disturbance: Secondary | ICD-10-CM | POA: Diagnosis present

## 2021-01-12 DIAGNOSIS — E43 Unspecified severe protein-calorie malnutrition: Secondary | ICD-10-CM | POA: Diagnosis present

## 2021-01-12 DIAGNOSIS — Z87820 Personal history of traumatic brain injury: Secondary | ICD-10-CM | POA: Diagnosis not present

## 2021-01-12 DIAGNOSIS — E854 Organ-limited amyloidosis: Secondary | ICD-10-CM | POA: Diagnosis present

## 2021-01-12 DIAGNOSIS — A5909 Other urogenital trichomoniasis: Secondary | ICD-10-CM | POA: Diagnosis present

## 2021-01-12 DIAGNOSIS — R4189 Other symptoms and signs involving cognitive functions and awareness: Secondary | ICD-10-CM | POA: Diagnosis not present

## 2021-01-12 DIAGNOSIS — Z833 Family history of diabetes mellitus: Secondary | ICD-10-CM | POA: Diagnosis not present

## 2021-01-12 DIAGNOSIS — Z6820 Body mass index (BMI) 20.0-20.9, adult: Secondary | ICD-10-CM | POA: Diagnosis not present

## 2021-01-12 DIAGNOSIS — G35 Multiple sclerosis: Secondary | ICD-10-CM | POA: Diagnosis present

## 2021-01-12 DIAGNOSIS — I739 Peripheral vascular disease, unspecified: Secondary | ICD-10-CM | POA: Diagnosis present

## 2021-01-12 DIAGNOSIS — Z20822 Contact with and (suspected) exposure to covid-19: Secondary | ICD-10-CM | POA: Diagnosis present

## 2021-01-12 DIAGNOSIS — E1165 Type 2 diabetes mellitus with hyperglycemia: Secondary | ICD-10-CM | POA: Diagnosis present

## 2021-01-12 DIAGNOSIS — R32 Unspecified urinary incontinence: Secondary | ICD-10-CM | POA: Diagnosis present

## 2021-01-12 DIAGNOSIS — R159 Full incontinence of feces: Secondary | ICD-10-CM | POA: Diagnosis present

## 2021-01-12 DIAGNOSIS — F17210 Nicotine dependence, cigarettes, uncomplicated: Secondary | ICD-10-CM | POA: Diagnosis present

## 2021-01-12 DIAGNOSIS — R1310 Dysphagia, unspecified: Secondary | ICD-10-CM | POA: Diagnosis present

## 2021-01-12 DIAGNOSIS — I10 Essential (primary) hypertension: Secondary | ICD-10-CM | POA: Diagnosis present

## 2021-01-12 DIAGNOSIS — Z8249 Family history of ischemic heart disease and other diseases of the circulatory system: Secondary | ICD-10-CM | POA: Diagnosis not present

## 2021-01-12 DIAGNOSIS — A549 Gonococcal infection, unspecified: Secondary | ICD-10-CM | POA: Diagnosis present

## 2021-01-12 DIAGNOSIS — I680 Cerebral amyloid angiopathy: Secondary | ICD-10-CM | POA: Diagnosis present

## 2021-01-12 DIAGNOSIS — J449 Chronic obstructive pulmonary disease, unspecified: Secondary | ICD-10-CM | POA: Diagnosis present

## 2021-01-12 DIAGNOSIS — Z79899 Other long term (current) drug therapy: Secondary | ICD-10-CM | POA: Diagnosis not present

## 2021-01-12 DIAGNOSIS — R292 Abnormal reflex: Secondary | ICD-10-CM | POA: Diagnosis present

## 2021-01-12 DIAGNOSIS — R410 Disorientation, unspecified: Secondary | ICD-10-CM | POA: Diagnosis not present

## 2021-01-12 DIAGNOSIS — R64 Cachexia: Secondary | ICD-10-CM | POA: Diagnosis present

## 2021-01-12 DIAGNOSIS — R531 Weakness: Secondary | ICD-10-CM | POA: Diagnosis not present

## 2021-01-12 LAB — CBC WITH DIFFERENTIAL/PLATELET
Abs Immature Granulocytes: 0 10*3/uL (ref 0.00–0.07)
Basophils Absolute: 0 10*3/uL (ref 0.0–0.1)
Basophils Relative: 1 %
Eosinophils Absolute: 0.1 10*3/uL (ref 0.0–0.5)
Eosinophils Relative: 1 %
HCT: 37.9 % (ref 36.0–46.0)
Hemoglobin: 12.7 g/dL (ref 12.0–15.0)
Immature Granulocytes: 0 %
Lymphocytes Relative: 38 %
Lymphs Abs: 2.3 10*3/uL (ref 0.7–4.0)
MCH: 32.2 pg (ref 26.0–34.0)
MCHC: 33.5 g/dL (ref 30.0–36.0)
MCV: 95.9 fL (ref 80.0–100.0)
Monocytes Absolute: 0.4 10*3/uL (ref 0.1–1.0)
Monocytes Relative: 7 %
Neutro Abs: 3.3 10*3/uL (ref 1.7–7.7)
Neutrophils Relative %: 53 %
Platelets: 206 10*3/uL (ref 150–400)
RBC: 3.95 MIL/uL (ref 3.87–5.11)
RDW: 12.9 % (ref 11.5–15.5)
WBC: 6.1 10*3/uL (ref 4.0–10.5)
nRBC: 0 % (ref 0.0–0.2)

## 2021-01-12 LAB — BASIC METABOLIC PANEL
Anion gap: 8 (ref 5–15)
BUN: 9 mg/dL (ref 6–20)
CO2: 26 mmol/L (ref 22–32)
Calcium: 9.4 mg/dL (ref 8.9–10.3)
Chloride: 103 mmol/L (ref 98–111)
Creatinine, Ser: 0.68 mg/dL (ref 0.44–1.00)
GFR, Estimated: >60 mL/min (ref >60)
Glucose, Bld: 88 mg/dL (ref 70–99)
Potassium: 3.7 mmol/L (ref 3.5–5.1)
Sodium: 137 mmol/L (ref 135–145)

## 2021-01-12 LAB — HEMOGLOBIN A1C
Hgb A1c MFr Bld: 5.3 % (ref 4.8–5.6)
Mean Plasma Glucose: 105.41 mg/dL

## 2021-01-12 MED ORDER — ADULT MULTIVITAMIN W/MINERALS CH
1.0000 | ORAL_TABLET | Freq: Every day | ORAL | Status: DC
  Administered 2021-01-12 – 2021-01-15 (×4): 1 via ORAL
  Filled 2021-01-12 (×4): qty 1

## 2021-01-12 MED ORDER — ENSURE ENLIVE PO LIQD
237.0000 mL | Freq: Three times a day (TID) | ORAL | Status: DC
  Administered 2021-01-12 – 2021-01-15 (×5): 237 mL via ORAL
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
## 2021-01-12 NOTE — TOC Progression Note (Addendum)
Transition of Care [facility]) - Progression Note    Patient Details  Name: Carla [name] MRN: [id number] Date of Birth: May 23, 1965  Transition of Care [facility]) CM/SW Contact  Mearl Latin, LCSW Phone Number: 01/12/2021, 12:59 PM  Clinical Narrative:    Patient does not have any SNF bed offers.  CSW requested [facility], [facility], [facility], [facility], [facility] review referral.   CSW faxed referral to [facility] and [facility]    UPDATE: [facility] unable to accept patient. CSW updated patient's daughter at bedside. [facility] and [facility] reviewing.    Expected Discharge Plan: Skilled Nursing Facility Barriers to Discharge: Continued Medical Work up, English as a second language teacher, Inadequate or no insurance, Unsafe home situation  Expected Discharge Plan and Services Expected Discharge Plan: Skilled Nursing Facility     Post Acute Care Choice: Skilled Nursing Facility Living arrangements for the past 2 months: Apartment                                       Social Determinants of Health (SDOH) Interventions    Readmission Risk Interventions No flowsheet data found.
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
## 2021-01-13 DIAGNOSIS — R4189 Other symptoms and signs involving cognitive functions and awareness: Secondary | ICD-10-CM | POA: Diagnosis not present

## 2021-01-13 LAB — CBC WITH DIFFERENTIAL/PLATELET
Abs Immature Granulocytes: 0.01 10*3/uL (ref 0.00–0.07)
Basophils Absolute: 0 10*3/uL (ref 0.0–0.1)
Basophils Relative: 0 %
Eosinophils Absolute: 0.1 10*3/uL (ref 0.0–0.5)
Eosinophils Relative: 1 %
HCT: 37.2 % (ref 36.0–46.0)
Hemoglobin: 12.3 g/dL (ref 12.0–15.0)
Immature Granulocytes: 0 %
Lymphocytes Relative: 59 %
Lymphs Abs: 2.8 10*3/uL (ref 0.7–4.0)
MCH: 32.4 pg (ref 26.0–34.0)
MCHC: 33.1 g/dL (ref 30.0–36.0)
MCV: 97.9 fL (ref 80.0–100.0)
Monocytes Absolute: 0.5 10*3/uL (ref 0.1–1.0)
Monocytes Relative: 11 %
Neutro Abs: 1.4 10*3/uL — ABNORMAL LOW (ref 1.7–7.7)
Neutrophils Relative %: 29 %
Platelets: 221 10*3/uL (ref 150–400)
RBC: 3.8 MIL/uL — ABNORMAL LOW (ref 3.87–5.11)
RDW: 13 % (ref 11.5–15.5)
WBC: 4.8 10*3/uL (ref 4.0–10.5)
nRBC: 0 % (ref 0.0–0.2)

## 2021-01-13 LAB — METHYLMALONIC ACID, SERUM: Methylmalonic Acid, Quantitative: 181 nmol/L (ref 0–378)

## 2021-01-13 LAB — BASIC METABOLIC PANEL
Anion gap: 7 (ref 5–15)
BUN: 12 mg/dL (ref 6–20)
CO2: 27 mmol/L (ref 22–32)
Calcium: 9.6 mg/dL (ref 8.9–10.3)
Chloride: 104 mmol/L (ref 98–111)
Creatinine, Ser: 0.77 mg/dL (ref 0.44–1.00)
GFR, Estimated: >60 mL/min (ref >60)
Glucose, Bld: 102 mg/dL — ABNORMAL HIGH (ref 70–99)
Potassium: 3.9 mmol/L (ref 3.5–5.1)
Sodium: 138 mmol/L (ref 135–145)

## 2021-01-13 LAB — ANTINUCLEAR ANTIBODIES, IFA: ANA Ab, IFA: NEGATIVE
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
## 2021-01-13 NOTE — TOC Progression Note (Addendum)
Transition of Care [facility]) - Progression Note    Patient Details  Name: Carla [name] MRN: [id number] Date of Birth: 1965/08/29  [facility]) CM/SW Contact  Mearl Latin, LCSW Phone Number: 01/13/2021, 1:23 PM  Clinical Narrative:    Per Lewayne Bunting and Susa Day, patient only has [facility], which does not cover SNF. CSW requested assistance from financial counseling to apply for full Medicaid/Disability.   UPDATE: Per Financial Counseling, patient does have full Medicaid Disability. CSW made [facility] aware and their business office will review patient.    Expected Discharge Plan: Skilled Nursing Facility Barriers to Discharge: Continued Medical Work up, English as a second language teacher, Inadequate or no insurance, Unsafe home situation  Expected Discharge Plan and Services Expected Discharge Plan: Skilled Nursing Facility     Post Acute Care Choice: Skilled Nursing Facility Living arrangements for the past 2 months: Apartment                                       Social Determinants of Health (SDOH) Interventions    Readmission Risk Interventions No flowsheet data found.
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
## 2021-01-14 DIAGNOSIS — R4189 Other symptoms and signs involving cognitive functions and awareness: Secondary | ICD-10-CM | POA: Diagnosis not present

## 2021-01-14 DIAGNOSIS — F03C0 Unspecified dementia, severe, without behavioral disturbance, psychotic disturbance, mood disturbance, and anxiety: Secondary | ICD-10-CM

## 2021-01-14 LAB — CBC WITH DIFFERENTIAL/PLATELET
Abs Immature Granulocytes: 0 10*3/uL (ref 0.00–0.07)
Basophils Absolute: 0 10*3/uL (ref 0.0–0.1)
Basophils Relative: 0 %
Eosinophils Absolute: 0.1 10*3/uL (ref 0.0–0.5)
Eosinophils Relative: 1 %
HCT: 40.8 % (ref 36.0–46.0)
Hemoglobin: 13.4 g/dL (ref 12.0–15.0)
Immature Granulocytes: 0 %
Lymphocytes Relative: 49 %
Lymphs Abs: 2.7 10*3/uL (ref 0.7–4.0)
MCH: 32.9 pg (ref 26.0–34.0)
MCHC: 32.8 g/dL (ref 30.0–36.0)
MCV: 100.2 fL — ABNORMAL HIGH (ref 80.0–100.0)
Monocytes Absolute: 0.6 10*3/uL (ref 0.1–1.0)
Monocytes Relative: 11 %
Neutro Abs: 2.1 10*3/uL (ref 1.7–7.7)
Neutrophils Relative %: 39 %
Platelets: 217 10*3/uL (ref 150–400)
RBC: 4.07 MIL/uL (ref 3.87–5.11)
RDW: 13.1 % (ref 11.5–15.5)
WBC: 5.4 10*3/uL (ref 4.0–10.5)
nRBC: 0 % (ref 0.0–0.2)

## 2021-01-14 LAB — SARS CORONAVIRUS 2 (TAT 6-24 HRS): SARS Coronavirus 2: NEGATIVE

## 2021-01-14 LAB — BASIC METABOLIC PANEL
Anion gap: 8 (ref 5–15)
BUN: 20 mg/dL (ref 6–20)
CO2: 24 mmol/L (ref 22–32)
Calcium: 9.8 mg/dL (ref 8.9–10.3)
Chloride: 101 mmol/L (ref 98–111)
Creatinine, Ser: 0.72 mg/dL (ref 0.44–1.00)
GFR, Estimated: >60 mL/min (ref >60)
Glucose, Bld: 103 mg/dL — ABNORMAL HIGH (ref 70–99)
Potassium: 4.1 mmol/L (ref 3.5–5.1)
Sodium: 133 mmol/L — ABNORMAL LOW (ref 135–145)

## 2021-01-14 LAB — ANTI-DNA ANTIBODY, DOUBLE-STRANDED: ds DNA Ab: 1 IU/mL (ref 0–9)
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
## 2021-01-14 NOTE — NC FL2 (Signed)
[place] MEDICAID FL2 LEVEL OF CARE SCREENING TOOL     IDENTIFICATION  Patient Name: Carla [name] Birthdate: 1965/06/28 Sex: female Admission Date (Current Location): 01/09/2021  [facility] and [place] Number:  Producer, television/film/video and Address:  [facility], [address] [place], [place] [address]      Provider Number: [id number]  Attending Physician Name and Address:  Glade Lloyd, MD  Relative Name and Phone Number:       Current Level of Care: Hospital Recommended Level of Care: Skilled Nursing Facility Prior Approval Number:    Date Approved/Denied:   PASRR Number: [id number]  Discharge Plan: SNF    Current Diagnoses: Patient Active Problem List   Diagnosis Date Noted   Dementia (HCC) 01/12/2021   Cognitive changes 01/10/2021   Influenza A 05/10/2017   COPD with acute exacerbation (HCC) 05/09/2017   Sepsis (HCC) 05/09/2017   Asthma, chronic, unspecified asthma severity, with acute exacerbation 05/09/2017   Asthma exacerbation 05/09/2017    Orientation RESPIRATION BLADDER Height & Weight     Self, Place  Normal Incontinent, External catheter Weight: 110 lb 3.7 oz (50 kg) Height:  5\' 1"  (154.9 cm)  BEHAVIORAL SYMPTOMS/MOOD NEUROLOGICAL BOWEL NUTRITION STATUS      Incontinent Diet (see DC summary)  AMBULATORY STATUS COMMUNICATION OF NEEDS Skin   Limited Assist Verbally Normal                       Personal Care Assistance Level of Assistance  Bathing, Feeding, Dressing Bathing Assistance: Limited assistance Feeding assistance: Limited assistance Dressing Assistance: Limited assistance     Functional Limitations Info  Speech     Speech Info: Impaired (dysarthria)    SPECIAL CARE FACTORS FREQUENCY  PT (By licensed PT), OT (By licensed OT)     PT Frequency: 5x/wk OT Frequency: 5x/wk            Contractures Contractures Info: Not present    Additional Factors Info  Code Status, Allergies Code Status  Info: Full Allergies Info: NKA           Current Medications (01/14/2021):  This is the current hospital active medication list Current Facility-Administered Medications  Medication Dose Route Frequency Provider Last Rate Last Admin   acetaminophen (TYLENOL) tablet 650 mg  650 mg Oral Q6H PRN 01/16/2021, DO       Or   acetaminophen (TYLENOL) suppository 650 mg  650 mg Rectal Q6H PRN Hillary Bow, DO       doxycycline (VIBRA-TABS) tablet 100 mg  100 mg Oral Q12H Curatolo, Adam, DO   100 mg at 01/14/21 1008   enoxaparin (LOVENOX) injection 40 mg  40 mg Subcutaneous Q24H 01/16/21 M, DO   40 mg at 01/14/21 1008   feeding supplement (ENSURE ENLIVE / ENSURE PLUS) liquid 237 mL  237 mL Oral TID BM Vann, Jessica U, DO   237 mL at 01/13/21 2144   ipratropium-albuterol (DUONEB) 0.5-2.5 (3) MG/3ML nebulizer solution 3 mL  3 mL Nebulization Q6H PRN 2145, MD       mometasone-formoterol [facility]) 100-5 MCG/ACT inhaler 2 puff  2 puff Inhalation BID [facility], MD   2 puff at 01/14/21 0746   multivitamin with minerals tablet 1 tablet  1 tablet Oral Daily 01/16/21 U, DO   1 tablet at 01/14/21 1008   ondansetron (ZOFRAN) tablet 4 mg  4 mg Oral Q6H PRN 01/16/21, DO  Or   ondansetron (ZOFRAN) injection 4 mg  4 mg Intravenous Q6H PRN Hillary Bow, DO         Discharge Medications: Please see discharge summary for a list of discharge medications.  Relevant Imaging Results:  Relevant Lab Results:   Additional Information SS#: [id number]. No covid vaccines. Please have palliative care follow up for goals of care.  Mearl Latin, LCSW

## 2021-01-14 NOTE — TOC Progression Note (Signed)
Transition of Care [facility]) - Progression Note    Patient Details  Name: Carla [name] MRN: [id number] Date of Birth: 08/26/1965  [facility] CM/SW Contact  Mearl Latin, LCSW Phone Number: 01/14/2021, 1:09 PM  Clinical Narrative:    [facility] able to offer a bed to patient; no other SNF able to accept patient. CSW spoke with patient's daughter and she will complete paperwork tomorrow with [facility] at 10am. Requested COVID test from MD.    Expected Discharge Plan: Skilled Nursing Facility Barriers to Discharge: Continued Medical Work up, English as a second language teacher, Inadequate or no insurance, Unsafe home situation  Expected Discharge Plan and Services Expected Discharge Plan: Skilled Nursing Facility     Post Acute Care Choice: Skilled Nursing Facility Living arrangements for the past 2 months: Apartment                                       Social Determinants of Health (SDOH) Interventions    Readmission Risk Interventions No flowsheet data found.
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
## 2021-01-14 NOTE — TOC Progression Note (Addendum)
Transition of Care [facility]) - Progression Note    Patient Details  Name: Carla [name] MRN: [id number] Date of Birth: January 15, 1966  [facility]) CM/SW Contact  Mearl Latin, LCSW Phone Number: 01/14/2021, 8:36 AM  Clinical Narrative:    [facility] unable to accept patient straight into long term care but will review patient anyway. Requested answer from [facility] and [facility] as they stated they were reviewing referral.    Expected Discharge Plan: Skilled Nursing Facility Barriers to Discharge: Continued Medical Work up, English as a second language teacher, Inadequate or no insurance, Unsafe home situation  Expected Discharge Plan and Services Expected Discharge Plan: Skilled Nursing Facility     Post Acute Care Choice: Skilled Nursing Facility Living arrangements for the past 2 months: Apartment                                       Social Determinants of Health (SDOH) Interventions    Readmission Risk Interventions No flowsheet data found.

## 2021-01-14 NOTE — Progress Notes (Signed)
Patient ID: Carla [name], female   DOB: [birth year], 55 y.o.   MRN: [id number]    Progress Note from the Palliative Medicine Team at [facility]   Patient Name: Carla [name]        Date: 01/14/2021 DOB: 04-22-1965  Age: 55 y.o. MRN#: [id number] Attending Physician: Glade Lloyd, MD Primary Care Physician: Patient, No Pcp Per (Inactive) Admit Date: 01/09/2021   Medical records reviewed   Unable to complete initial GOCs with family today, to discharge in the morning.  Discussed with Matthew Folks and recommend OP Palliative services to f/u at facility for GOC discussion.  No charge    Lorinda Creed NP  Palliative Medicine Team Team Phone # [phone] Pager [phone]

## 2021-01-15 DIAGNOSIS — R4189 Other symptoms and signs involving cognitive functions and awareness: Secondary | ICD-10-CM | POA: Diagnosis not present

## 2021-01-15 LAB — CBC WITH DIFFERENTIAL/PLATELET
Abs Immature Granulocytes: 0.02 10*3/uL (ref 0.00–0.07)
Basophils Absolute: 0 10*3/uL (ref 0.0–0.1)
Basophils Relative: 0 %
Eosinophils Absolute: 0.1 10*3/uL (ref 0.0–0.5)
Eosinophils Relative: 1 %
HCT: 38.5 % (ref 36.0–46.0)
Hemoglobin: 12.5 g/dL (ref 12.0–15.0)
Immature Granulocytes: 0 %
Lymphocytes Relative: 35 %
Lymphs Abs: 2.4 10*3/uL (ref 0.7–4.0)
MCH: 32.2 pg (ref 26.0–34.0)
MCHC: 32.5 g/dL (ref 30.0–36.0)
MCV: 99.2 fL (ref 80.0–100.0)
Monocytes Absolute: 0.5 10*3/uL (ref 0.1–1.0)
Monocytes Relative: 8 %
Neutro Abs: 3.9 10*3/uL (ref 1.7–7.7)
Neutrophils Relative %: 56 %
Platelets: 249 10*3/uL (ref 150–400)
RBC: 3.88 MIL/uL (ref 3.87–5.11)
RDW: 13.2 % (ref 11.5–15.5)
WBC: 6.9 10*3/uL (ref 4.0–10.5)
nRBC: 0 % (ref 0.0–0.2)

## 2021-01-15 LAB — BASIC METABOLIC PANEL
Anion gap: 8 (ref 5–15)
BUN: 23 mg/dL — ABNORMAL HIGH (ref 6–20)
CO2: 25 mmol/L (ref 22–32)
Calcium: 9.9 mg/dL (ref 8.9–10.3)
Chloride: 104 mmol/L (ref 98–111)
Creatinine, Ser: 0.66 mg/dL (ref 0.44–1.00)
GFR, Estimated: >60 mL/min (ref >60)
Glucose, Bld: 114 mg/dL — ABNORMAL HIGH (ref 70–99)
Potassium: 4.3 mmol/L (ref 3.5–5.1)
Sodium: 137 mmol/L (ref 135–145)

## 2021-01-15 LAB — ANGIOTENSIN CONVERTING ENZYME

## 2021-01-15 NOTE — TOC Transition Note (Signed)
Transition of Care [facility]) - CM/SW Discharge Note   Patient Details  Name: Carla [name] MRN: [id number] Date of Birth: Jul 29, 1965  Transition of Care [facility]) CM/SW Contact:  Mearl Latin, LCSW Phone Number: 01/15/2021, 9:55 AM   Clinical Narrative:    Patient will DC to: [facility] Anticipated DC date: 01/15/21 Family notified: [name] Transport by: Bonner Puna   Per MD patient ready for DC to [facility]. RN to call report prior to discharge [phone] room [phone]). RN, patient, patient's family, and facility notified of DC. Discharge Summary and FL2 sent to facility. DC packet on chart. Ambulance transport requested for patient.   CSW will sign off for now as social work intervention is no longer needed. Please consult [place] again if new needs arise.     Final next level of care: Skilled Nursing Facility Barriers to Discharge: Barriers Resolved   Patient Goals and CMS Choice Patient states their goals for this hospitalization and ongoing recovery are:: patient unable to participate in goal setting, confused CMS Medicare.gov Compare Post Acute Care list provided to:: Patient Represenative (must comment) Choice offered to / list presented to : Adult Children  Discharge Placement   Existing PASRR number confirmed : 01/15/21          Patient chooses bed at: [facility] Patient to be transferred to facility by: [facility] Name of family member notified: Daughter Patient and family notified of of transfer: 01/15/21  Discharge Plan and Services     Post Acute Care Choice: Skilled Nursing Facility                               Social Determinants of Health (SDOH) Interventions     Readmission Risk Interventions No flowsheet data found.
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
## 2021-01-15 NOTE — Progress Notes (Signed)
SLP Cancellation Note  Patient Details Name: Carla [name] MRN: [id number] DOB: 1965/08/29   Cancelled treatment:       Reason Eval/Treat Not Completed: Patient at procedure or test/unavailable   Tressie Stalker, M.S., CCC-SLP 01/15/2021, 2:59 PM

## 2021-01-18 LAB — MISC LABCORP TEST (SEND OUT): Labcorp test code: 70115

## 2021-02-04 ENCOUNTER — Other Ambulatory Visit: Payer: Self-pay

## 2021-02-04 ENCOUNTER — Non-Acute Institutional Stay: Payer: Medicaid Other

## 2021-02-04 VITALS — HR 70 | Temp 98.3°F | Resp 22

## 2021-02-04 DIAGNOSIS — Z515 Encounter for palliative care: Secondary | ICD-10-CM

## 2021-02-04 NOTE — Progress Notes (Signed)
PATIENT NAME: Carla [name] DOB: 08-Jun-1965 MRN: [id number]  PRIMARY CARE PROVIDER: Patient, No Pcp Per (Inactive)  RESPONSIBLE PARTY:  Acct ID - Guarantor Home Phone Work Phone Relationship Acct Type  [id number] Carla [name]* [phone]  Self P/F     [address], [place], [place] [address]    PLAN OF CARE and INTERVENTIONS:               1.  GOALS OF CARE/ ADVANCE CARE PLANNING:  Reviewed emergency chart.  MOST form present.  Full code, full scope of medical interventions, IVF and ABT.               2.  PATIENT/CAREGIVER EDUCATION:  none at this time.               4. PERSONAL EMERGENCY PLAN:  Activate 911 for emergencies.                5.  DISEASE STATUS:  Carla [name]-unit supervisor reports patient just moved from [facility]200 on 2nd shift yesterday. She has requested that I obtain any information from [place], LPN on the rehab unit as she has limited information.  On rehab unit, and unable to locate a staff member to assist with patient information. Returned to patient's unit and obtained some information from CNA working with patient.   Patient found in the dinning area with a rolling walker awaiting breakfast.  Attempts to engage in conversation but displays expressive aphasia.   Incontinence: Aide reports ambulating with patient to the bathroom yesterday where she was continent of bladder.  On her arrival today, patient was incontinent of bowel and bladder.  Weakness:  Patient is utilizing a rolling walker.  No recent falls reported.  Staff report patient does require stand by assistance with transfers.  Phone call made to daughter Carla [name].  No answer, message left requesting a callback to further discuss goals of care.      HISTORY OF PRESENT ILLNESS:  Ms. Carla [name] is a 55 year old female with a history of COPD, Asthma,  DM, and Dementia.  Patient is being followed by Palliative Care every 4-8 weeks and PRN.   CODE STATUS: Full ADVANCED DIRECTIVES: No MOST FORM: Yes PPS:  40%   PHYSICAL EXAM:   VITALS: Today's Vitals   02/04/21 0851  Pulse: 70  Resp: (!) 22  Temp: 98.3 F (36.8 C)  SpO2: 99%  PainSc: 0-No pain    LUNGS: clear to auscultation  CARDIAC: Cor RRR}  EXTREMITIES: - for edema SKIN: Skin color, texture, turgor normal. No rashes or lesions or normal  NEURO: positive for gait problems and weakness       Carla Merle, RN

## 2021-03-31 ENCOUNTER — Other Ambulatory Visit: Payer: Self-pay

## 2021-03-31 ENCOUNTER — Ambulatory Visit: Payer: Medicaid Other | Admitting: Psychiatry

## 2021-03-31 ENCOUNTER — Encounter: Payer: Self-pay | Admitting: Psychiatry

## 2021-03-31 VITALS — BP 167/79 | HR 60

## 2021-03-31 DIAGNOSIS — I639 Cerebral infarction, unspecified: Secondary | ICD-10-CM | POA: Diagnosis not present

## 2021-03-31 DIAGNOSIS — F0150 Vascular dementia without behavioral disturbance: Secondary | ICD-10-CM
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
## 2021-03-31 NOTE — Patient Instructions (Addendum)
Ultrasound of heart CT scan of blood vessels in the head and neck Start Aspirin 81 mg daily Start Donepezil (Aricept) 5mg  tab once a day for the first 4 weeks, then increase the dose to 10mg  once per day. It is better to take Donepezil with breakfast or lunch.  Referral to neuropsychology Blood work - check cholesterol today

## 2021-04-09 ENCOUNTER — Telehealth: Payer: Self-pay | Admitting: Psychiatry

## 2021-04-09 NOTE — Telephone Encounter (Signed)
For the CTA's mcd healthy blue Berkley Harvey: [id number] (exp. 04/09/21 to 06/07/21) order sent to GI.  Echo no auth req-sent Lupita Leash a message she will reach out to the patient to schedule.

## 2021-04-15 ENCOUNTER — Other Ambulatory Visit: Payer: Self-pay

## 2021-04-15 ENCOUNTER — Ambulatory Visit (HOSPITAL_COMMUNITY)
Admission: RE | Admit: 2021-04-15 | Discharge: 2021-04-15 | Disposition: A | Discharge status: Home / Self Care | Payer: Medicaid Other | Source: Ambulatory Visit | Attending: Psychiatry | Admitting: Psychiatry

## 2021-04-15 ENCOUNTER — Encounter: Payer: Self-pay | Admitting: Psychology

## 2021-04-15 ENCOUNTER — Encounter (HOSPITAL_COMMUNITY): Payer: Self-pay

## 2021-04-15 DIAGNOSIS — I6389 Other cerebral infarction: Secondary | ICD-10-CM

## 2021-04-15 DIAGNOSIS — I639 Cerebral infarction, unspecified: Secondary | ICD-10-CM | POA: Insufficient documentation

## 2021-04-15 DIAGNOSIS — I10 Essential (primary) hypertension: Secondary | ICD-10-CM | POA: Insufficient documentation

## 2021-04-15 DIAGNOSIS — E119 Type 2 diabetes mellitus without complications: Secondary | ICD-10-CM | POA: Insufficient documentation

## 2021-04-15 LAB — ECHOCARDIOGRAM COMPLETE BUBBLE STUDY
Area-P 1/2: 2.95 cm2
Calc EF: 60.8 %
S' Lateral: 2.1 cm
Single Plane A2C EF: 62.3 %
Single Plane A4C EF: 56.1 %

## 2021-05-06 ENCOUNTER — Other Ambulatory Visit: Payer: Self-pay | Admitting: Psychiatry

## 2021-05-06 ENCOUNTER — Other Ambulatory Visit: Payer: Medicaid Other

## 2021-05-06 ENCOUNTER — Ambulatory Visit
Admission: RE | Admit: 2021-05-06 | Discharge: 2021-05-06 | Disposition: A | Discharge status: Home / Self Care | Payer: Medicaid Other | Source: Ambulatory Visit | Attending: Psychiatry | Admitting: Psychiatry

## 2021-05-06 DIAGNOSIS — I6523 Occlusion and stenosis of bilateral carotid arteries: Secondary | ICD-10-CM

## 2021-05-06 DIAGNOSIS — I639 Cerebral infarction, unspecified: Secondary | ICD-10-CM

## 2021-05-06 IMAGING — CT CT ANGIO HEAD-NECK (W OR W/O PERF)
2 of 11 series · 6 of 35 positions shown · non-contrast
Comparison: Head CT [DATE] and MRI [DATE]

CLINICAL DATA: Stroke follow-up.

Creatinine was obtained on site at [HOSPITAL] at [REDACTED].
Results: Creatinine 0.6 mg/dL.
EXAM:
CT ANGIOGRAPHY HEAD AND NECK
TECHNIQUE: Multidetector CT imaging of the head and neck was performed using
the standard protocol during bolus administration of intravenous
contrast. Multiplanar CT image reconstructions and MIPs were
obtained to evaluate the vascular anatomy. Carotid stenosis
measurements (when applicable) are obtained utilizing NASCET
criteria, using the distal internal carotid diameter as the
denominator.

[Series 11: brain 3.00 hr40 s3 sag without ibhc · sagittal · non-contrast · 0.33mm/px · 3 of 54 slices shown]
[im 2/54  soft-tissue]
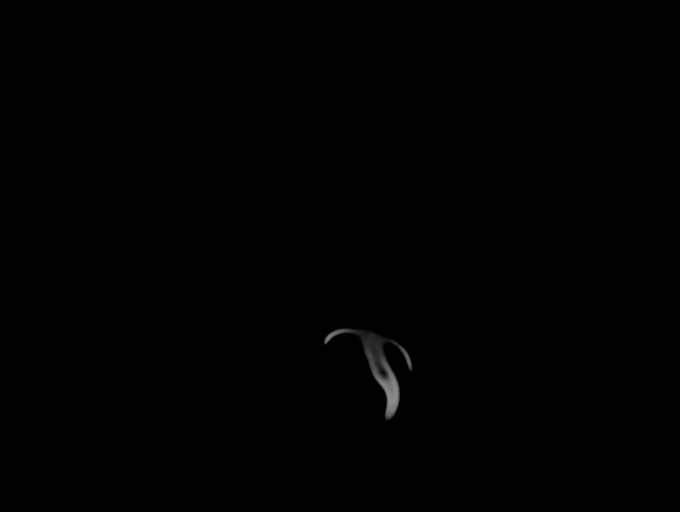
[im 27/54  soft-tissue]
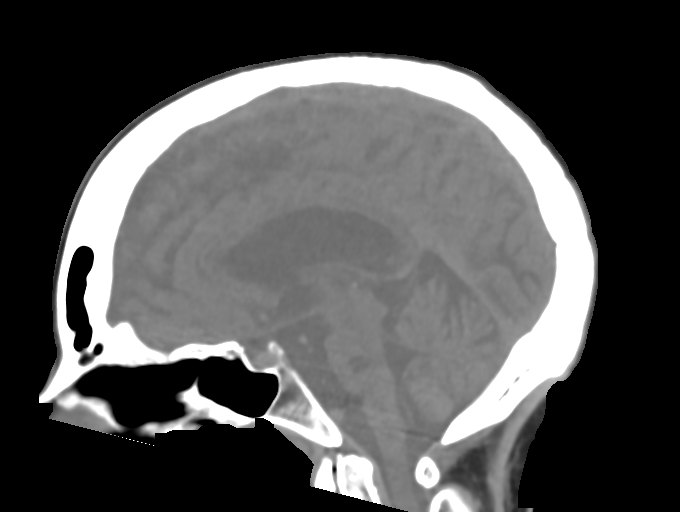
[im 53/54  soft-tissue]
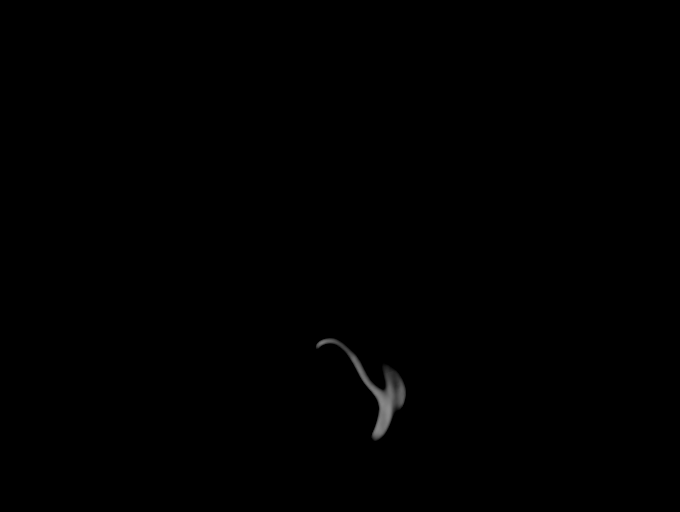

[Series 14: cta head & neck 1.00 hv48 s3 ax thin mips · axial · 0.46mm/px · z∈[-877,-581]mm · 3 of 297 slices shown]
[im 1/297  soft-tissue]
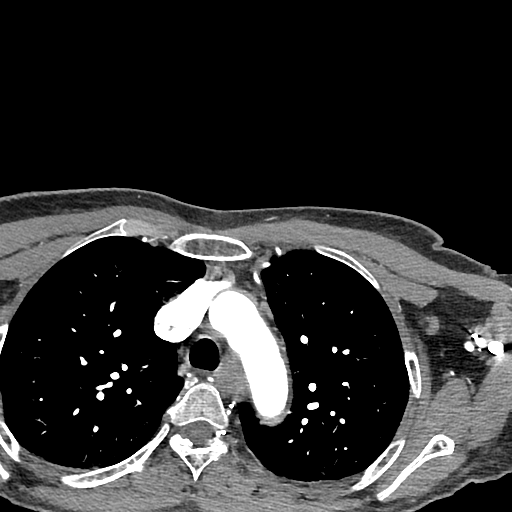
[im 149/297  bone]
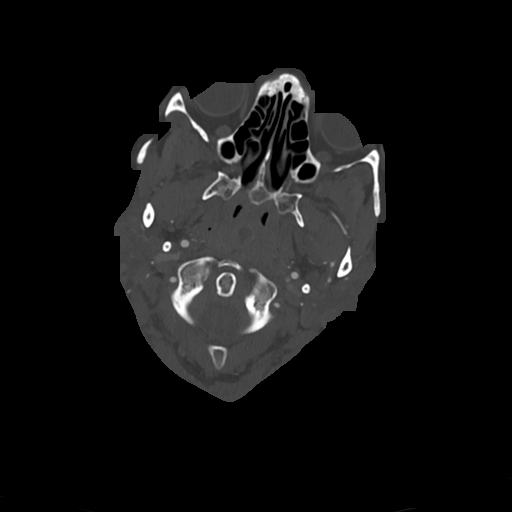
[im 297/297  soft-tissue]
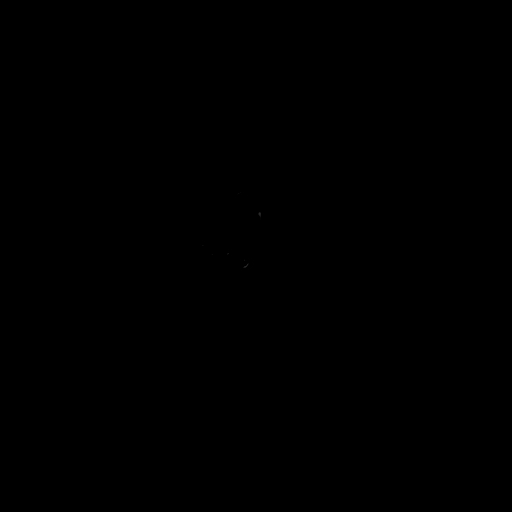

[6 of 35 positions shown; findings below may reference images not displayed]

RADIATION DOSE REDUCTION: This exam was performed according to the
departmental dose-optimization program which includes automated
exposure control, adjustment of the mA and/or kV according to
patient size and/or use of iterative reconstruction technique.

CONTRAST:  75mL [SL] IOPAMIDOL ([SL]) INJECTION 76%
FINDINGS: CT HEAD FINDINGS

Brain: There is no evidence of an acute infarct, intracranial
hemorrhage, mass, midline shift, or extra-axial fluid collection.
Confluent hypodensities in the cerebral white matter bilaterally are
unchanged and nonspecific but compatible with severe chronic small
vessel ischemic disease given vascular risk factors. Chronic lacunar
infarcts are again noted in the basal ganglia, thalami, pons, and
cerebellum. Mild cerebral atrophy is advanced for age.

Vascular: Calcified atherosclerosis at the skull base.

Skull: No fracture or suspicious osseous lesion.

Sinuses: Paranasal sinuses and mastoid air cells are clear.

Orbits: Unremarkable.

Review of the MIP images confirms the above findings

CTA NECK FINDINGS

Aortic arch: Normal variant aortic arch branching pattern with
common origin of the brachiocephalic and left common carotid
arteries. Patent brachiocephalic and subclavian arteries with a
small amount of soft plaque in the proximal left subclavian artery
not resulting in significant stenosis.

Right carotid system: Patent with a small amount of soft plaque at
the carotid bifurcation. No evidence of a significant stenosis or
dissection. Retropharyngeal course of the proximal ICA.

Left carotid system: Patent with a small amount of calcified and
soft plaque at the carotid bifurcation. No evidence of a significant
stenosis or dissection.

Vertebral arteries: Patent without evidence of stenosis, dissection,
or significant atherosclerosis. Moderately dominant right vertebral
artery.

Skeleton: Moderate cervical disc degeneration.  Edentulous.

Other neck: Unchanged 9 mm Tornwaldt cyst. No evidence of cervical
lymphadenopathy.

Upper chest: Mild centrilobular emphysema.

Review of the MIP images confirms the above findings

CTA HEAD FINDINGS

Anterior circulation: The internal carotid arteries are patent from
skull base to carotid termini with atherosclerotic plaque resulting
in mild bilateral cavernous stenosis, left greater than right. ACAs
and MCAs are patent with mild to moderate branch vessel irregularity
but no evidence of a proximal branch occlusion or flow limiting
proximal stenosis. No aneurysm is identified.

Posterior circulation: The intracranial vertebral arteries are
patent to the basilar with mild atherosclerotic type irregularity
but no flow limiting stenosis. The left vertebral artery is small
distal to the PICA origin. Patent PICA, AICA, and SCA origins are
identified bilaterally. The basilar artery is patent with mild
irregularity but no significant stenosis. Posterior communicating
arteries are diminutive or absent. The right PCAs patent with mild
irregularity but no significant proximal stenosis. There is a severe
stenosis or short segment occlusion of the proximal left P1 segment,
and there is occlusion of a proximal left P2 branch vessel. No
aneurysm is identified.

Venous sinuses: As permitted by contrast timing, patent.

Anatomic variants: None.

Review of the MIP images confirms the above findings
IMPRESSION: 1. Age advanced intracranial atherosclerosis including mild
bilateral ICA stenoses, severe stenosis versus short segment
occlusion of the proximal left P1 segment, and occlusion of a
proximal left P2 branch vessel.
2. Patent cervical carotid and vertebral arteries without
significant stenosis.
3. Severe chronic small vessel ischemic disease with numerous
chronic lacunar infarcts.
4.  Emphysema ([SL]-[SL]).

## 2021-05-06 MED ORDER — IOPAMIDOL (ISOVUE-370) INJECTION 76%
75.0000 mL | Freq: Once | INTRAVENOUS | Status: AC | PRN
  Administered 2021-05-06: 75 mL via INTRAVENOUS

## 2021-06-12 ENCOUNTER — Other Ambulatory Visit (HOSPITAL_COMMUNITY): Payer: Self-pay | Admitting: *Deleted

## 2021-06-12 ENCOUNTER — Telehealth (HOSPITAL_COMMUNITY): Payer: Self-pay

## 2021-06-12 DIAGNOSIS — R1310 Dysphagia, unspecified: Secondary | ICD-10-CM

## 2021-06-12 NOTE — Telephone Encounter (Signed)
Received order from [facility] to schedule patient for OP MBS - called scheduler at facility and left voicemail. ?

## 2021-06-23 ENCOUNTER — Ambulatory Visit (HOSPITAL_COMMUNITY)
Admission: RE | Admit: 2021-06-23 | Discharge: 2021-06-23 | Disposition: A | Discharge status: Home / Self Care | Payer: Medicaid Other | Source: Ambulatory Visit | Attending: Internal Medicine | Admitting: Internal Medicine

## 2021-06-23 ENCOUNTER — Other Ambulatory Visit: Payer: Self-pay

## 2021-06-23 DIAGNOSIS — E119 Type 2 diabetes mellitus without complications: Secondary | ICD-10-CM | POA: Insufficient documentation

## 2021-06-23 DIAGNOSIS — R1310 Dysphagia, unspecified: Secondary | ICD-10-CM

## 2021-06-23 DIAGNOSIS — I10 Essential (primary) hypertension: Secondary | ICD-10-CM | POA: Diagnosis not present

## 2021-06-23 NOTE — Progress Notes (Signed)
Modified Barium Swallow Progress Note ? ?Patient Details  ?Name: Carla [name] ?MRN: [id number] ?Date of Birth: [birth year] ? ?Today's Date: 06/23/2021 ? ?Modified Barium Swallow completed.  Full report located under Chart Review in the Imaging Section. ? ?Brief recommendations include the following: ? ?Clinical Impression ? Pt presents with an oral more than pharyngeal dysphagia. She has significant oral deficits that include inadequate labial seal and lingual coordination/propulsion. She uses a straw as she has trouble getting liquid from a spoon, but after she swallows there is still anterior loss of what looks like barium mixed with secretions. Lingual pumping, delayed oral transit, and reduced bolus cohesion are noted. Boluses spill to the pyriform sinuses before the swallow as she is working on clearing her oral cavity. Despite this pharyngeal delay, her pharyngeal swallow looks relatively functional once her swallow is initiated. However, this delay results in aspiration before the swallow with thin liquids and solids. Aspiration of thin liquids was in smaller volumes and silent. Aspiration of solids elicited a reflexive cough that sounded strong, but did not clear the airway (PAS 7), and despite cues from SLP to try to swallow what was still in her pharynx, she ended up aspirating more of the solids before she could initiate any swallowing. Would defer diet to primary SLP at SNF, but would consider modifying solids until they are soft enough that she can more readily clear her mouth. If she can do this relatively timely on current chopped diet, then this might be appropriate, but otherwise could consider going as soft as to purees. Purees and nectar thick liquids both were not aspirated during this study. Would continue SLP f/u at SNF. Also note that pt appeared to have a functional bedside evaluation in October 2022. Although she does have a h/o dysarthria from prior CVA, it is unclear why her oropharyngeal  swallowing, voice, and speech have deteriorated over this time. May wish to consider ongoing w/u as indicated. ?  ?Swallow Evaluation Recommendations ? ?   ? ? SLP Diet Recommendations: Dysphagia 2 (Fine chop) solids;Dysphagia 1 (Puree) solids;Nectar thick liquid ? ? Liquid Administration via: Straw ? ? Medication Administration: Crushed with puree ? ? Supervision: Patient able to self feed;Staff to assist with self feeding ? ? Compensations: Slow rate;Small sips/bites ? ? Postural Changes: Seated upright at 90 degrees ? ? Oral Care Recommendations: Oral care BID ? ?   ? ? ? ?Mahala Menghini., M.A. CCC-SLP ?Acute Rehabilitation Services ?Pager [phone] ?Office [phone] ? ?06/23/2021,1:23 PM ?

## 2021-07-01 ENCOUNTER — Encounter: Payer: Medicaid Other | Attending: Psychology | Admitting: Psychology

## 2021-07-01 ENCOUNTER — Encounter: Payer: Self-pay | Admitting: Psychology

## 2021-07-01 DIAGNOSIS — I639 Cerebral infarction, unspecified: Secondary | ICD-10-CM | POA: Diagnosis not present

## 2021-07-01 DIAGNOSIS — R4189 Other symptoms and signs involving cognitive functions and awareness: Secondary | ICD-10-CM | POA: Diagnosis not present

## 2021-07-01 DIAGNOSIS — F01B0 Vascular dementia, moderate, without behavioral disturbance, psychotic disturbance, mood disturbance, and anxiety: Secondary | ICD-10-CM | POA: Insufficient documentation
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
## 2021-07-21 DIAGNOSIS — I639 Cerebral infarction, unspecified: Secondary | ICD-10-CM | POA: Diagnosis not present

## 2021-07-21 DIAGNOSIS — F01B0 Vascular dementia, moderate, without behavioral disturbance, psychotic disturbance, mood disturbance, and anxiety: Secondary | ICD-10-CM

## 2021-07-21 NOTE — Progress Notes (Signed)
? ?  Behavioral Observations ?The patient appeared well-groomed and appropriately dressed. Her manners were polite and appropriate to the situation. Disabilities were noted as the patient was in a wheelchair and had difficulty moving on her own. The patient's speech was gargled which contributed to difficulty communicating. Because of this, she did not respond to most items on the assessment as time either ran out or she was unable to communicate her response. The patient appeared inattentive throughout the test but seemed to understand most instructions.   ? ?Neuropsychology Note ? ?[name] completed 60 minutes of neuropsychological testing with technician, Dina Rich, BA, under the supervision of Ilean Skill, PsyD., Clinical Neuropsychologist. The patient did not appear overtly distressed by the testing session, per behavioral observation or via self-report to the technician. Rest breaks were offered.  ? ?Clinical Decision Making: In considering the patient's current level of functioning, level of presumed impairment, nature of symptoms, emotional and behavioral responses during clinical interview, level of literacy, and observed level of motivation/effort, a battery of tests was selected by Dr. Sima Matas during initial consultation on 07/01/2021. This was communicated to the technician. Communication between the neuropsychologist and technician was ongoing throughout the testing session and changes were made as deemed necessary based on patient performance on testing, technician observations and additional pertinent factors such as those listed above. ? ?Tests Administered: ?Finger Tapping Test (FTT) ?Grooved Pegboard ?Repeatable Battery for the Assessment of Neuropsychological Status (RBANS); Form A ? ?Results: ? ?Finger Tapping Test ?Right hand ?5 ?8 ?8 ?8 ?7 ?Left hand ?8 ?6 ?7 ?7 ?5 ? ? ? ? ? ?Grooved Pegboard ?*unable to complete ? ? ? ? ?Results from the RBANS-A will be included in final  report.  ? ?Feedback to Patient: ?Carla [name] will return on 09/09/2021 for an interactive feedback session with Dr. Sima Matas at which time her test performances, clinical impressions and treatment recommendations will be reviewed in detail. The patient understands she can contact our office should she require our assistance before this time. ? ?60 minutes spent face-to-face with patient administering standardized tests, 30 minutes spent scoring (technician). [CPT [id number], [id number] ? ?Full report to follow.  ?

## 2021-08-12 ENCOUNTER — Encounter: Payer: Self-pay | Admitting: Psychology

## 2021-08-12 ENCOUNTER — Encounter: Payer: Medicaid Other | Attending: Psychology | Admitting: Psychology

## 2021-08-12 DIAGNOSIS — I69319 Unspecified symptoms and signs involving cognitive functions following cerebral infarction: Secondary | ICD-10-CM | POA: Diagnosis not present

## 2021-08-12 DIAGNOSIS — F01B0 Vascular dementia, moderate, without behavioral disturbance, psychotic disturbance, mood disturbance, and anxiety: Secondary | ICD-10-CM | POA: Diagnosis present

## 2021-08-12 DIAGNOSIS — I639 Cerebral infarction, unspecified: Secondary | ICD-10-CM

## 2021-08-12 DIAGNOSIS — R4189 Other symptoms and signs involving cognitive functions and awareness: Secondary | ICD-10-CM | POA: Diagnosis present
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
## 2021-09-01 ENCOUNTER — Ambulatory Visit: Payer: Medicaid Other | Admitting: Psychology

## 2021-09-09 ENCOUNTER — Encounter: Payer: Medicaid Other | Attending: Psychology | Admitting: Psychology

## 2021-09-09 ENCOUNTER — Encounter: Payer: Self-pay | Admitting: Psychology

## 2021-09-09 DIAGNOSIS — F01B0 Vascular dementia, moderate, without behavioral disturbance, psychotic disturbance, mood disturbance, and anxiety: Secondary | ICD-10-CM | POA: Insufficient documentation

## 2021-09-09 DIAGNOSIS — R4189 Other symptoms and signs involving cognitive functions and awareness: Secondary | ICD-10-CM | POA: Diagnosis present

## 2021-09-09 DIAGNOSIS — I639 Cerebral infarction, unspecified: Secondary | ICD-10-CM | POA: Diagnosis present
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
## 2021-09-15 ENCOUNTER — Ambulatory Visit: Payer: Medicaid Other | Admitting: Speech Pathology

## 2021-09-28 ENCOUNTER — Ambulatory Visit: Payer: Medicaid Other | Admitting: Psychiatry

## 2021-10-13 ENCOUNTER — Ambulatory Visit: Payer: Medicaid Other | Admitting: Psychology

## 2021-12-28 ENCOUNTER — Emergency Department (HOSPITAL_COMMUNITY): Payer: Medicaid Other

## 2021-12-28 ENCOUNTER — Emergency Department (HOSPITAL_COMMUNITY)
Admission: EM | Admit: 2021-12-28 | Discharge: 2021-12-28 | Disposition: A | Discharge status: Home / Self Care | Payer: Medicaid Other | Attending: Emergency Medicine | Admitting: Emergency Medicine

## 2021-12-28 ENCOUNTER — Encounter (HOSPITAL_COMMUNITY): Payer: Self-pay | Admitting: Emergency Medicine

## 2021-12-28 DIAGNOSIS — F0150 Vascular dementia without behavioral disturbance: Secondary | ICD-10-CM | POA: Insufficient documentation

## 2021-12-28 DIAGNOSIS — J189 Pneumonia, unspecified organism: Secondary | ICD-10-CM

## 2021-12-28 DIAGNOSIS — I10 Essential (primary) hypertension: Secondary | ICD-10-CM | POA: Insufficient documentation

## 2021-12-28 DIAGNOSIS — W19XXXA Unspecified fall, initial encounter: Secondary | ICD-10-CM | POA: Diagnosis not present

## 2021-12-28 MED ORDER — AZITHROMYCIN 250 MG PO TABS
500.0000 mg | ORAL_TABLET | Freq: Every day | ORAL | Status: DC
  Administered 2021-12-28: 500 mg via ORAL
  Filled 2021-12-28: qty 2

## 2021-12-28 MED ORDER — AMOXICILLIN-POT CLAVULANATE 875-125 MG PO TABS: 1.0000 | ORAL_TABLET | Freq: Two times a day (BID) | ORAL | 0 refills | Status: DC

## 2021-12-28 MED ORDER — AMOXICILLIN-POT CLAVULANATE 400-57 MG/5ML PO SUSR: 400.0000 mg | Freq: Three times a day (TID) | ORAL | 0 refills | Status: AC

## 2021-12-28 MED ORDER — AMOXICILLIN-POT CLAVULANATE 875-125 MG PO TABS
1.0000 | ORAL_TABLET | Freq: Once | ORAL | Status: DC
  Filled 2021-12-28: qty 1

## 2021-12-28 MED ORDER — AZITHROMYCIN 200 MG/5ML PO SUSR: 200.0000 mg | Freq: Every day | ORAL | 0 refills | Status: DC

## 2021-12-28 MED ORDER — AZITHROMYCIN 250 MG PO TABS: 250.0000 mg | ORAL_TABLET | Freq: Every day | ORAL | 0 refills | Status: DC

## 2021-12-28 MED ORDER — AMOXICILLIN-POT CLAVULANATE 400-57 MG/5ML PO SUSR: 400.0000 mg | Freq: Two times a day (BID) | ORAL | Status: DC

## 2021-12-28 MED ORDER — AMOXICILLIN-POT CLAVULANATE 400-57 MG/5ML PO SUSR
400.0000 mg | Freq: Two times a day (BID) | ORAL | Status: DC
  Administered 2021-12-28: 400 mg via ORAL
  Filled 2021-12-28: qty 5

## 2021-12-28 MED ORDER — AZITHROMYCIN 200 MG/5ML PO SUSR: 500.0000 mg | Freq: Once | ORAL | Status: DC

## 2021-12-28 NOTE — ED Triage Notes (Signed)
Pt BIB EMS from [facility], c/o unwitnessed fall. Staff could not endorse last known well or how long the pt was down. Per EMS, staff stated "she fell and we have to send her out per protocol." Staff could not endorse oxygen dependency. VSS

## 2021-12-28 NOTE — ED Provider Notes (Signed)
[facility] DEPT Provider Note   CSN: [id number] Arrival date & time: 12/28/21  1652     History  Chief Complaint  Patient presents with  . Fall    Carla [name] is a 56 y.o. female.   Fall     Patient with medical history of hypertension, speech abnormality, vascular dementia, previous CVA and cognitive changes presents today due to unwitnessed fall at nursing home.  Nursing home is unsure how long patient was down, they do not witness the fall themselves.  Prescription will her fall try to get up and move Swelling.  Patient had a past medical history of dysphagia, laryngeal reflux has been followed by ENT, requesting second opinion.  Spoke with patient's daughter Ms. Lewis, informed patient usually is communicative and follows commands but is confused at baseline secondary to dementia.  Not on blood thinners, supposed to be in a walker and not to ambulate although sometimes she tries to get out of bed and use the bathroom and has been known to fall in the past.  Home Medications Prior to Admission medications   Medication Sig Start Date End Date Taking? Authorizing Provider  amoxicillin-clavulanate (AUGMENTIN) 875-125 MG tablet Take 1 tablet by mouth every 12 (twelve) hours. 12/28/21  Yes Sherrill Raring, PA-C  azithromycin (ZITHROMAX) 250 MG tablet Take 1 tablet (250 mg total) by mouth daily. Take first 2 tablets together, then 1 every day until finished. 12/28/21  Yes Sherrill Raring, PA-C  albuterol (PROVENTIL HFA;VENTOLIN HFA) 108 (90 Base) MCG/ACT inhaler Inhale 2 puffs into the lungs every 6 (six) hours as needed for wheezing. 05/11/17   Rai, Vernelle Emerald, MD  Cholecalciferol (VITAMIN D3) 50 MCG (2000 UT) capsule Take 2,000 Units by mouth daily.    [provider]  ipratropium-albuterol (DUONEB) 0.5-2.5 (3) MG/3ML SOLN Take 3 mLs by nebulization.    [provider]  mometasone-formoterol (DULERA) 200-5 MCG/ACT AERO Inhale 2 puffs into  the lungs 2 (two) times daily. 05/11/17   Rai, Vernelle Emerald, MD  sertraline (ZOLOFT) 50 MG tablet Take 50 mg by mouth daily. 03/16/21   [provider]      Allergies    Patient has no known allergies.    Review of Systems   Review of Systems  Physical Exam Updated Vital Signs BP 112/64   Pulse 80   Temp 97.7 F (36.5 C)   Resp 19   SpO2 98%  Physical Exam Vitals and nursing note reviewed. Exam conducted with a chaperone present.  Constitutional:      Appearance: Normal appearance.  HENT:     Head: Normocephalic and atraumatic.     Mouth/Throat:     Comments: Handling secretions but drooling, this is baseline per family who is now at bedside. Eyes:     General: No scleral icterus.       Right eye: No discharge.        Left eye: No discharge.     Extraocular Movements: Extraocular movements intact.     Pupils: Pupils are equal, round, and reactive to light.  Cardiovascular:     Rate and Rhythm: Normal rate and regular rhythm.     Pulses: Normal pulses.     Heart sounds: Normal heart sounds. No murmur heard.    No friction rub. No gallop.  Pulmonary:     Effort: Pulmonary effort is normal. No respiratory distress.     Breath sounds: Normal breath sounds.     Comments: Mildly tachypneic but lungs  are clear to auscultation bilaterally Abdominal:     General: Abdomen is flat. Bowel sounds are normal. There is no distension.     Palpations: Abdomen is soft.     Tenderness: There is no abdominal tenderness.  Musculoskeletal:        General: No tenderness.     Comments: Moving upper and lower extremities that any significant difficulty.  No reproducible tenderness.  Skin:    General: Skin is warm and dry.     Coloration: Skin is not jaundiced.  Neurological:     Mental Status: She is alert. Mental status is at baseline.     Coordination: Coordination normal.     Comments: Follows commands, pleasantly demented.  Cranial nerves II to XII are grossly intact, upper and  lower extremity strength is roughly symmetric bilaterally.     ED Results / Procedures / Treatments   Labs (all labs ordered are listed, but only abnormal results are displayed) Labs Reviewed - No data to display  EKG EKG Interpretation  Date/Time:  Monday December 28 2021 17:10:27 EDT Ventricular Rate:  82 PR Interval:  147 QRS Duration: 90 QT Interval:  295 QTC Calculation: 345 R Axis:   86 Text Interpretation: Sinus rhythm Right atrial enlargement Probable anteroseptal infarct, old since last tracing no significant change Confirmed by Rolan Bucco [phone]) on 12/28/2021 7:18:20 PM  Radiology DG Pelvis 1-2 Views  Result Date: 12/28/2021 CLINICAL DATA:  fall EXAM: PELVIS - 1-2 VIEW COMPARISON:  X-ray abdomen 07/30/2009 FINDINGS: Vague cortical irregularity along the right inferior pubic rami. Limited evaluation due to overlapping osseous structures and overlying soft tissues. There is no evidence of definite acute displaced pelvic fracture or diastasis. Frontal view of bilateral hips grossly unremarkable with no definite acute displaced fracture or dislocation. No severe arthropathy. No pelvic bone lesions are seen. IMPRESSION: Negative for definite acute traumatic injury. Electronically Signed   By: Tish Frederickson M.D.   On: 12/28/2021 18:55   DG Chest 2 View  Result Date: 12/28/2021 CLINICAL DATA:  fall EXAM: CHEST - 2 VIEW COMPARISON:  CT chest 10/26/2016, chest x-ray 01/09/2021 FINDINGS: The heart and mediastinal contours are unchanged. Aortic calcification. Diffuse, bilateral lower lung zones predominant, patchy airspace and interstitial opacities. No pleural effusion. No pneumothorax. No acute osseous abnormality. IMPRESSION: 1. Diffuse, bilateral lower lung zones predominant, patchy airspace and interstitial opacities. Finding may represent infection/inflammation. 2.  Aortic Atherosclerosis (ICD10-I70.0). Electronically Signed   By: Tish Frederickson M.D.   On: 12/28/2021 18:53    CT Head Wo Contrast  Result Date: 12/28/2021 CLINICAL DATA:  Head trauma, moderate-severe; Neck trauma, impaired ROM (Age 55-64y) EXAM: CT HEAD WITHOUT CONTRAST CT CERVICAL SPINE WITHOUT CONTRAST TECHNIQUE: Multidetector CT imaging of the head and cervical spine was performed following the standard protocol without intravenous contrast. Multiplanar CT image reconstructions of the cervical spine were also generated. RADIATION DOSE REDUCTION: This exam was performed according to the departmental dose-optimization program which includes automated exposure control, adjustment of the mA and/or kV according to patient size and/or use of iterative reconstruction technique. COMPARISON:  CT head 05/06/2021, CT angio neck 05/06/2021 FINDINGS: CT HEAD FINDINGS BRAIN: BRAIN Patchy and confluent areas of decreased attenuation are noted throughout the deep and periventricular white matter of the cerebral hemispheres bilaterally, compatible with chronic microvascular ischemic disease. Left occipital encephalomalacia. No evidence of large-territorial acute infarction. No parenchymal hemorrhage. No mass lesion. No extra-axial collection. No mass effect or midline shift. No hydrocephalus. Basilar cisterns are patent. Vascular: No  hyperdense vessel. Atherosclerotic calcifications are present within the cavernous internal carotid arteries. Skull: No acute fracture or focal lesion. Sinuses/Orbits: Frothy secretion within the right sphenoid sinus. Otherwise paranasal sinuses and mastoid air cells are clear. The orbits are unremarkable. Other: None. CT CERVICAL SPINE FINDINGS Alignment: Straightening of the normal cervical lordosis likely due to positioning and degenerative changes. Skull base and vertebrae: Mild to moderate degenerative changes of the spine. No associated severe osseous neural foraminal or central canal stenosis. No acute fracture. No aggressive appearing focal osseous lesion or focal pathologic process. Soft  tissues and spinal canal: No prevertebral fluid or swelling. No visible canal hematoma. Upper chest: Emphysematous changes. Other: None. IMPRESSION: 1. No acute intracranial abnormality. 2. No acute displaced fracture or traumatic listhesis of the cervical spine. 3.  Emphysema (ICD10-J43.9). Electronically Signed   By: Tish Frederickson M.D.   On: 12/28/2021 18:51   CT Cervical Spine Wo Contrast  Result Date: 12/28/2021 CLINICAL DATA:  Head trauma, moderate-severe; Neck trauma, impaired ROM (Age 95-64y) EXAM: CT HEAD WITHOUT CONTRAST CT CERVICAL SPINE WITHOUT CONTRAST TECHNIQUE: Multidetector CT imaging of the head and cervical spine was performed following the standard protocol without intravenous contrast. Multiplanar CT image reconstructions of the cervical spine were also generated. RADIATION DOSE REDUCTION: This exam was performed according to the departmental dose-optimization program which includes automated exposure control, adjustment of the mA and/or kV according to patient size and/or use of iterative reconstruction technique. COMPARISON:  CT head 05/06/2021, CT angio neck 05/06/2021 FINDINGS: CT HEAD FINDINGS BRAIN: BRAIN Patchy and confluent areas of decreased attenuation are noted throughout the deep and periventricular white matter of the cerebral hemispheres bilaterally, compatible with chronic microvascular ischemic disease. Left occipital encephalomalacia. No evidence of large-territorial acute infarction. No parenchymal hemorrhage. No mass lesion. No extra-axial collection. No mass effect or midline shift. No hydrocephalus. Basilar cisterns are patent. Vascular: No hyperdense vessel. Atherosclerotic calcifications are present within the cavernous internal carotid arteries. Skull: No acute fracture or focal lesion. Sinuses/Orbits: Frothy secretion within the right sphenoid sinus. Otherwise paranasal sinuses and mastoid air cells are clear. The orbits are unremarkable. Other: None. CT CERVICAL  SPINE FINDINGS Alignment: Straightening of the normal cervical lordosis likely due to positioning and degenerative changes. Skull base and vertebrae: Mild to moderate degenerative changes of the spine. No associated severe osseous neural foraminal or central canal stenosis. No acute fracture. No aggressive appearing focal osseous lesion or focal pathologic process. Soft tissues and spinal canal: No prevertebral fluid or swelling. No visible canal hematoma. Upper chest: Emphysematous changes. Other: None. IMPRESSION: 1. No acute intracranial abnormality. 2. No acute displaced fracture or traumatic listhesis of the cervical spine. 3.  Emphysema (ICD10-J43.9). Electronically Signed   By: Tish Frederickson M.D.   On: 12/28/2021 18:51    Procedures Procedures    Medications Ordered in ED Medications  amoxicillin-clavulanate (AUGMENTIN) 875-125 MG per tablet 1 tablet (has no administration in time range)  azithromycin (ZITHROMAX) tablet 500 mg (has no administration in time range)    ED Course/ Medical Decision Making/ A&P Clinical Course as of 12/28/21 2042  Mon Dec 28, 2021  1741 Attempted to reach [facility], transferred to the floor. Sent to voicemail.  Voicemail left. [HS]  2042 Nursing staff informed patient was able to take his azithromycin but difficulty swallowing the Augmentin.  Requesting suspension, swish medicine and sent p.o. for changes to pharmacy. [HS]    Clinical Course User Index [HS] Theron Arista, [place]  Medical Decision Making Amount and/or Complexity of Data Reviewed Radiology: ordered.  Risk Prescription drug management.   Patient presents due to fall.  Differential includes not limited to fracture, dislocation, intracranial injury, C-spine injury.  History is obtained from the patient, also obtained by family members for now at bedside.  Unable to contact nursing home for collateral information but EMS note was also  reviewed by myself.  I reviewed external medical records, patient has vascular dementia.   I ordered, viewed and interpreted imaging studies. CT head and neck are negative for acute process.  Agree with radiologist. Plain film of pelvis is negative for fracture dislocation. Chest x-ray is notable for bilateral inflammation versus infection.    Patient is afebrile on exam, not hypoxic. Her lungs are clear to auscultation.  I discussed the read with the family, they do endorse patient's been coughing more than normal in the last few days.  We will proceed to treat empirically then with antibiotics to cover for suspected pneumonia.  Patient has had previous falls, she is supposed to be using a walker at baseline but often gets up in the middle the night and is fall previously.  I do think it was most likely mechanical fall rather than acute metabolic process.  First dose of antibiotic treatment here in the emergency department, prescription sent to her home pharmacy.  I considered labs but given no systemic illness or symptoms I do not think indicated.  I think it was most likely mechanical fall and we discussed very strict return precautions to which family verbalized understanding.  Patient stable at this time for close outpatient follow-up and strict return precautions.  Case discussed with my attending who agrees with plan.        Final Clinical Impression(s) / ED Diagnoses Final diagnoses:  Fall, initial encounter  Pneumonia of both lungs due to infectious organism, unspecified part of lung    Rx / DC Orders ED Discharge Orders          Ordered    amoxicillin-clavulanate (AUGMENTIN) 875-125 MG tablet  Every 12 hours        12/28/21 1924    azithromycin (ZITHROMAX) 250 MG tablet  Daily        12/28/21 1924              Theron Arista, Cordelia Poche 12/28/21 1928    Rolan Bucco, MD 12/28/21 1943

## 2021-12-28 NOTE — Discharge Instructions (Addendum)
You are seen today in the emergency department due to a fall.  The imaging of your head and neck is normal, no signs of fracture or dislocation of the ribs or in the pelvis.  Chest x-ray shows findings that could be suggestive of pneumonia so we are covering with antibiotics, the first dose of Augmentin and azithromycin was given you today in the emergency department, start taking the other doses tomorrow.  Information is provided above for ENT follow-up as requested, please follow-up with primary for reevaluation later next week to make sure cough is resolved.  If she is having new symptoms such as mental status changes, abdominal pain, weakness or concerning behavior return to ED for additional evaluation.

## 2022-01-05 ENCOUNTER — Telehealth: Payer: Self-pay | Admitting: Psychiatry

## 2022-01-05 NOTE — Telephone Encounter (Signed)
Pt daughter is calling. Stated Pt was at the emergency room last week and she was told to follow up with PCP. Stated she have question for Dr. Billey Gosling. She is requesting a call-back

## 2022-01-05 NOTE — Telephone Encounter (Signed)
Called daughter on DPR, and she stated MD at ED told her to have patient see Dr Melissa Montane, ENT. She alled Dr Janace Hoard office and was told they couldn't see her because they need a referral. Daughter stated this is for her swallowing issues. I advised daughter the patient has been seeing MD at [facility], Dr Ebbie Latus, last seen June . I gave daughter the #. She stated she will call to get follow up for mother, verbalized understanding, appreciation.

## 2022-01-20 ENCOUNTER — Other Ambulatory Visit: Payer: Self-pay | Admitting: Otolaryngology

## 2022-01-20 ENCOUNTER — Ambulatory Visit: Payer: Medicaid Other | Admitting: Psychiatry

## 2022-01-20 DIAGNOSIS — R1310 Dysphagia, unspecified: Secondary | ICD-10-CM

## 2022-01-20 NOTE — Progress Notes (Deleted)
   CC:  Dementia  Follow-up Visit  Last visit: 03/31/21  Brief HPI: 56 year old female with a history of  COPD, asthma, DM, TBI in 1999 who follows in clinic for vascular dementia. Brain MRI with multiple lacunar infarcts, severe chronic small vessel ischemic disease, and atrophy greater than expected for age.  At her last visit she was started on ASA 81 mg daily and donepezil. She was referred for neuropsych testing. Interval History: *** CTA head/neck showed severe stenosis vs occlusion at left P1 and occlusion of left P2. Aspirin*** She never got her lipid panel drawn and is not on a statin.  Memory***donepezil***  She was hospitalized at the beginning of October after an unwitnessed fall. CTH and C-spine were negative for an acute process.  Physical Exam:   Vital Signs: There were no vitals taken for this visit. GENERAL:  well appearing, in no acute distress, alert  SKIN:  Color, texture, turgor normal. No rashes or lesions HEAD:  Normocephalic/atraumatic. RESP: normal respiratory effort MSK:  No gross joint deformities.   NEUROLOGICAL: Mental Status: Alert, oriented to person, place and time, Follows commands, and Speech fluent and appropriate. Cranial Nerves: PERRL, face symmetric, no dysarthria, hearing grossly intact Motor: moves all extremities equally Gait: normal-based.  IMPRESSION: ***  PLAN: *** -lipid panel***  Follow-up: ***  I spent a total of *** minutes on the date of the service. Discussed medication side effects, adverse reactions and drug interactions. Written educational materials and patient instructions outlining all of the above were given.  Genia Harold, MD

## 2022-01-28 ENCOUNTER — Ambulatory Visit
Admission: RE | Admit: 2022-01-28 | Discharge: 2022-01-28 | Disposition: A | Discharge status: Home / Self Care | Payer: Medicaid Other | Source: Ambulatory Visit | Attending: Otolaryngology | Admitting: Otolaryngology

## 2022-01-28 DIAGNOSIS — R1310 Dysphagia, unspecified: Secondary | ICD-10-CM

## 2022-02-04 ENCOUNTER — Other Ambulatory Visit (HOSPITAL_COMMUNITY): Payer: Self-pay

## 2022-02-04 DIAGNOSIS — R1310 Dysphagia, unspecified: Secondary | ICD-10-CM

## 2022-02-12 ENCOUNTER — Ambulatory Visit (HOSPITAL_COMMUNITY)
Admission: RE | Admit: 2022-02-12 | Discharge: 2022-02-12 | Disposition: A | Discharge status: Home / Self Care | Payer: Medicaid Other | Source: Ambulatory Visit | Attending: Otolaryngology | Admitting: Otolaryngology

## 2022-02-12 ENCOUNTER — Ambulatory Visit (HOSPITAL_COMMUNITY)
Admission: RE | Admit: 2022-02-12 | Discharge: 2022-02-12 | Disposition: A | Discharge status: Home / Self Care | Payer: Medicaid Other | Source: Ambulatory Visit

## 2022-02-12 DIAGNOSIS — R1310 Dysphagia, unspecified: Secondary | ICD-10-CM | POA: Insufficient documentation

## 2022-02-12 DIAGNOSIS — R0989 Other specified symptoms and signs involving the circulatory and respiratory systems: Secondary | ICD-10-CM | POA: Diagnosis not present

## 2022-02-12 DIAGNOSIS — J4489 Other specified chronic obstructive pulmonary disease: Secondary | ICD-10-CM | POA: Diagnosis not present

## 2022-02-12 DIAGNOSIS — R531 Weakness: Secondary | ICD-10-CM | POA: Diagnosis not present

## 2022-04-13 ENCOUNTER — Ambulatory Visit: Payer: Medicaid Other | Admitting: Psychiatry

## 2022-04-13 ENCOUNTER — Encounter: Payer: Self-pay | Admitting: Psychiatry

## 2022-04-13 VITALS — BP 121/73 | HR 83 | Ht 62.0 in | Wt 86.0 lb

## 2022-04-13 DIAGNOSIS — E785 Hyperlipidemia, unspecified: Secondary | ICD-10-CM | POA: Diagnosis not present

## 2022-04-13 DIAGNOSIS — F0150 Vascular dementia without behavioral disturbance: Secondary | ICD-10-CM | POA: Diagnosis not present

## 2022-04-13 NOTE — Patient Instructions (Signed)
Start Namenda (this is a medication for your memory)  -take it with Aricept  -watch for headache or dizziness   week one:  take 1/2 tablet ('5mg'$ ) in the morning   week two:  take 1/2 tablet ('5mg'$ ) in the morning and take 1/2 tablet ('5mg'$ ) in the evening  week three: take 1 tablet ('10mg'$ ) in the morning and take 1/2 tablet ('5mg'$ ) in the evening  week four:  take 1 tablet ('10mg'$ ) in the morning and take 1 tablet ('10mg'$ ) in the evening   -this is the full dose

## 2022-04-13 NOTE — Progress Notes (Signed)
   CC:  dementia  Follow-up Visit  Last visit: 03/31/21  Brief HPI: 57 year old female with a history of COPD, asthma, DM, TBI in 1999 who follows in clinic for dementia. MRI brain 01/10/21 with extensive confluence FLAIR signal abnormality, multiple remote lacunar infarcts and moderate volume loss (more than expected for age), and scattered microhemorrhages.   At her last visit, stroke workup was ordered. She was started on ASA 81 and donepezil. Referral for neuropsychology was placed.  Interval History: She has continued to decline since her last visit. Has not been eating as much and has been losing weight. Per daughter she does not like the food at her nursing home as they serve the same thing every day. Mobility has gotten worse and she is using a wheelchair all the time now. Memory seems worse as well. She will forget things that were said to her the previous day. Did not remember what she ate for lunch today. She continues to take donepezil and is tolerating it well.  She underwent neuropsychological testing in June 2023 which showed profound cognitive deficits, with a pattern consistent with vascular dementia.  CTA head/neck 05/06/21 showed age advanced intracranial stenosis including mild bilateral ICA stenosis, severe stenosis versus short segment occlusion of the proximal left P1 segment, and occlusion of a proximal left P2 branch vessel.   TTE showed EF of 60-65% with no evidence of shunt.  Lipid panel was ordered but she never had the blood work drawn. She continues to take ASA 81 mg daily without issues.   Physical Exam:   Vital Signs: BP 121/73   Pulse 83   Ht 5\' 2"  (1.575 m)   Wt 86 lb (39 kg)   BMI 15.73 kg/m  GENERAL:  well appearing, in no acute distress, alert  SKIN:  Color, texture, turgor normal. No rashes or lesions HEAD:  Normocephalic/atraumatic. RESP: normal respiratory effort MSK:  No gross joint deformities.   NEUROLOGICAL: Mental Status: Nonverbal.  Nods in response to "yes" or "no" questions. Follows some one-step commands.  Cranial Nerves: PERRL, face symmetric, no dysarthria, hearing grossly intact Motor: moves all extremities equally Gait: Uses wheelchair at baseline  IMPRESSION: 57 year old female with a history of COPD, asthma, DM, TBI in 1999 who presents for follow up of dementia. Brain MRI findings and neuropsychological testing are most consistent with vascular dementia. Will check lipid panel today and start atorvastatin for cholesterol management. Her memory has declined since her last visit. Will continue donepezil and start Namenda for her memory.  PLAN: -lipid panel -start atorvastatin 40 mg daily, continue ASA 81 mg daily -continue donepezil 10 mg daily, start namenda (5 mg in AM x1 week, then 5 mg BID x1 week, then 5/10 x 1 week, then 10 mg BID)  Follow-up: 6 months  I spent a total of 46 minutes on the date of the service. Discussed medication side effects, adverse reactions and drug interactions. Written educational materials and patient instructions outlining all of the above were given.  Genia Harold, MD 04/13/22 3:09 PM

## 2022-04-14 LAB — LIPID PANEL
Chol/HDL Ratio: 2.8 ratio (ref 0.0–4.4)
Cholesterol, Total: 213 mg/dL — ABNORMAL HIGH (ref 100–199)
HDL: 75 mg/dL (ref >39)
LDL Chol Calc (NIH): 115 mg/dL — ABNORMAL HIGH (ref 0–99)
Triglycerides: 135 mg/dL (ref 0–149)
VLDL Cholesterol Cal: 23 mg/dL (ref 5–40)

## 2022-05-03 ENCOUNTER — Encounter: Payer: Self-pay | Admitting: Family Medicine

## 2022-05-03 ENCOUNTER — Non-Acute Institutional Stay: Payer: Medicaid Other | Admitting: Family Medicine

## 2022-05-03 VITALS — BP 102/62 | HR 69 | Temp 98.0°F | Resp 20

## 2022-05-03 DIAGNOSIS — Z515 Encounter for palliative care: Secondary | ICD-10-CM

## 2022-05-03 DIAGNOSIS — R1310 Dysphagia, unspecified: Secondary | ICD-10-CM

## 2022-05-03 NOTE — Progress Notes (Unsigned)
[facility] Consult Note Telephone: [phone]  Fax: [phone]    Date of encounter: 05/03/22 4:06 PM PATIENT NAME: Carla [name] [address] [place] [address]   [phone] (home)  DOB: 27-Apr-1965 MRN: [id number] PRIMARY CARE PROVIDER:    Patient, No Pcp Per,  No address on file None  REFERRING PROVIDER:   No referring provider defined for this encounter. N/A  RESPONSIBLE PARTY:    Contact Information     Name Relation Home Work [place] Daughter [phone]     Miles Costain   [phone]        I met face to face with patient in [facility] skilled nursing facility. Palliative Care was asked to follow this patient by consultation request of  No ref. provider found to address advance care planning and complex medical decision making. This is a follow up visit   ASSESSMENT , SYMPTOM MANAGEMENT AND PLAN / RECOMMENDATIONS:  Dysphagia Give small amounts of food when pt alert and sitting upright.  Keep upright for 30 minutes after eating. Alternate sips/bites. Recommend supervised intake to decrease speed of eating.  Palliative Care Encounter Follow up with pt's daughter on goals of care  Advance Care Planning/Goals of Care:  CODE STATUS:  Full Code    Follow up Palliative Care Visit: Palliative care will continue to follow for complex medical decision making, advance care planning, and clarification of goals. Return 4 weeks or prn.    This visit was coded based on medical decision making (MDM).  PPS: 50%  HOSPICE ELIGIBILITY/DIAGNOSIS: TBD  Chief Complaint:  Palliative Care is continuing to follow patient for chronic medical management in setting of dementia and to assist with advanced care planning, refining and defining goals of care.   HISTORY OF PRESENT ILLNESS:  Carla [name] is a 57 y.o. year old female with TBI 1999,  dementia, COPD and asthma who  continues to smoke daily, Diabetes, HTN.  Pt is seen lying in bed, watching TV.  Denies CP, SOB, nausea, dysuria, constipation, skin breakdown, falls.  She states her appetite is good and she does not have any coughing/choking after eating or drinking. Per notes from Neurology pt's mobility had declined and she was using wheelchair constantly for mobility.  At that point she was on Donepezil and was to be started on Namenda as well as Atorvastatin.  History obtained from review of EMR, discussion with facility staff and/or Ms. Klinkner.      Latest Ref Rng & Units 01/15/2021    1:26 AM 01/14/2021    2:40 AM 01/13/2021    2:23 AM  CBC  WBC 4.0 - 10.5 K/uL 6.9  5.4  4.8   Hemoglobin 12.0 - 15.0 g/dL 12.5  13.4  12.3   Hematocrit 36.0 - 46.0 % 38.5  40.8  37.2   Platelets 150 - 400 K/uL 249  217  221        Latest Ref Rng & Units 01/15/2021    1:26 AM 01/14/2021    2:40 AM 01/13/2021    2:23 AM  CMP  Glucose 70 - 99 mg/dL 114  103  102   BUN 6 - 20 mg/dL 23  20  12    Creatinine 0.44 - 1.00 mg/dL 0.66  0.72  0.77   Sodium 135 - 145 mmol/L 137  133  138   Potassium 3.5 - 5.1 mmol/L 4.3  4.1  3.9   Chloride 98 - 111 mmol/L  104  101  104   CO2 22 - 32 mmol/L 25  24  27    Calcium 8.9 - 10.3 mg/dL 9.9  9.8  9.6        Latest Ref Rng & Units 01/08/2021    3:26 PM 05/27/2019   10:48 PM 05/10/2017    5:41 AM  Hepatic Function  Total Protein 6.5 - 8.1 g/dL 8.1  8.3  6.6   Albumin 3.5 - 5.0 g/dL 3.9  4.3  2.8   AST 15 - 41 U/L 20  20  32   ALT 0 - 44 U/L 13  15  17    Alk Phosphatase 38 - 126 U/L 70  75  63   Total Bilirubin 0.3 - 1.2 mg/dL 0.5  1.0  0.5   Lipid Panel     Component Value Date/Time   CHOL 213 (H) 04/13/2022 1502   TRIG 135 04/13/2022 1502   HDL 75 04/13/2022 1502   CHOLHDL 2.8 04/13/2022 1502   LDLCALC 115 (H) 04/13/2022 1502   LABVLDL 23 04/13/2022 1502    02/12/22 MBS: Per pt's daughter, she continues to have trouble swallowing and has lost weight. She  coughs/chokes during meals; has been on a dysphagia 1 diet with nectar thick liquids. She requires full supervision with meals but her daughter reports finding her unsupervised and coughing while eating. The pt has been followed by Dr. Fredric Dine, ENT, who last saw her on 01/18/22. Laryngoscopy has revealed normal true vocal fold mobility, mild interarytenoid edema and erythema.  Ms. Nations continues to present with a primary oral dysphagia with pharyngeal deficit related only to delay in onset of the swallow. She demonstrates repetitive/disorganized tongue motion, losing portion of bolus anteriorly. For all tested consistencies - thin and nectar thick liquids, purees- the barium reached the pyriform sinuses before the swallow was triggered. Upon swallowing, there was normal laryngeal elevation, epiglottic movement, pharyngeal stripping, and laryngeal vestibule closure. There was no penetration/aspiration. However, it is likely that when drinking larger, sequential boluses of liquids, the swallow delay leads to aspiration before the swallow onset (consistent with march '23 MBS). Pt would benefit from dysphagia therapy to target slowing down/pacing boluses and bolus control, if cognition allows. Spaced Retrieval may be a useful tool to teach her self-pacing. Recommend continuing dysphagia 1/purees (severity of oral dysphagia would prohibit any mechanical/chopped consistency) and nectar liquids during meals. Allow thin liquids between meals without solid foods - encourage single sips.   I reviewed EMR for available labs, medications, imaging, studies and related documents.  There are no new records since last visit/Records reviewed and summarized above.   ROS General: NAD EYES: denies vision changes ENMT: denies dysphagia Cardiovascular: denies chest pain, denies DOE Pulmonary: denies cough, denies increased SOB Abdomen: endorses good appetite, denies constipation, endorses continence of bowel GU: denies  dysuria, endorses continence of urine MSK:  denies increased weakness,  no falls reported Skin: denies rashes or wounds Neurological: denies pain, denies insomnia Psych: Endorses positive mood Heme/lymph/immuno: denies bruises, abnormal bleeding  Physical Exam: Current and past weights: 86 lbs on 04/13/22 at Neurologists office, was 82 lbs on 01/19/22 Constitutional: NAD General:  thin, WD ENMT: intact hearing CV: S1S2, RRR, no LE edema Pulmonary: CTAB, no increased work of breathing, no cough, room air Abdomen: normo-active BS + 4 quadrants, soft and non tender, no ascites GU: deferred MSK: no sarcopenia, moves all extremities, ambulatory Skin: warm and dry, no rashes or wounds on visible skin Neuro:  no generalized  weakness, mild cognitive impairment Psych: non-anxious affect, A and O x 2 Hem/lymph/immuno: no widespread bruising   Thank you for the opportunity to participate in the care of Ms. Alessandrini.  The palliative care team will continue to follow. Please call our office at [phone] if we can be of additional assistance.   Marijo Conception, FNP -C  COVID-19 PATIENT SCREENING TOOL Asked and negative response unless otherwise noted:   Have you had symptoms of covid, tested positive or been in contact with someone with symptoms/positive test in the past 5-10 days?  unknown

## 2022-05-04 DIAGNOSIS — R1310 Dysphagia, unspecified: Secondary | ICD-10-CM | POA: Insufficient documentation

## 2022-05-04 DIAGNOSIS — Z515 Encounter for palliative care: Secondary | ICD-10-CM | POA: Insufficient documentation

## 2022-07-30 ENCOUNTER — Non-Acute Institutional Stay: Payer: Medicaid Other | Admitting: Family Medicine

## 2022-07-30 ENCOUNTER — Encounter: Payer: Self-pay | Admitting: Family Medicine

## 2022-07-30 VITALS — BP 98/56 | HR 59 | Temp 96.8°F | Resp 16

## 2022-07-30 DIAGNOSIS — F0150 Vascular dementia without behavioral disturbance: Secondary | ICD-10-CM

## 2022-07-30 DIAGNOSIS — J449 Chronic obstructive pulmonary disease, unspecified: Secondary | ICD-10-CM

## 2022-07-30 DIAGNOSIS — R636 Underweight: Secondary | ICD-10-CM

## 2022-07-30 DIAGNOSIS — Z515 Encounter for palliative care: Secondary | ICD-10-CM

## 2022-07-30 NOTE — Progress Notes (Signed)
Therapist, nutritional Palliative Care Consult Note Telephone: [phone]  Fax: [phone]   Date of encounter: 07/30/22 9:42 AM PATIENT NAME: Carla [name] [address] [place] [address]   [phone] (home)  DOB: 12-21-1965 MRN: [id number] PRIMARY CARE PROVIDER:    Patient, No Pcp Per,  No address on file None  REFERRING PROVIDER:   No referring provider defined for this encounter. N/A  Emergency Contact:    Contact Information     Name Relation Home Work [place] Daughter [phone]     Earl Lites   [phone]       Health Care POA/Health Care Agent   I met face to face with patient in [facility] facility. Palliative Care was asked to follow this patient by consultation request of No ref. provider found to address advance care planning and complex medical decision making. This is a follow up visit.   Review of an advance directive document-MOST indicating full treatment completed in 2022.  CODE STATUS: Full Code at present  10:10 am Left vm for daughter [name] to return call.    ASSESSMENT AND / RECOMMENDATIONS:  PPS: 40%  Vascular dementia unspecified          severity or mood disorder Fast 7 Score 6E Currently on Aricept 10 mg and Sertraline 75 mg daily. Continue to encourage pt to be OOB in common areas for meals if possible. Monitor for pocketing, coughing or choking when eating or drinking.  2.     Underweight Recommend house protein supplement TID between meals and/or Medpass with medications to help supplement calories.   3.     Palliative Care Encounter Left vm with pt's daughter to discuss goals of care and update code status prn  Follow up Palliative Care Visit:  Palliative Care continuing to follow up by monitoring for changes in appetite, weight, functional and cognitive status for chronic disease progression and management in agreement with patient's stated goals of  care. Next visit in 4 weeks or prn.  This visit was coded based on medical decision making (MDM).  Chief Complaint  Palliative Care is continuing to follow pt for chronic medical management in setting of dementia with hx of stroke.  HISTORY OF PRESENT ILLNESS: Carla [name] is a 57 y.o. year old female with dementia and history of a stroke. She only responds to verbal questions with a nod of her head yes or no, offers no verbal response.  Denies pain, SOB, Nausea, vomiting, dysuria or constipation.  Aide had just finished cleaning her up after having had a BM. She denies pt has had any recent falls or difficulty with coughing/choking after eating or drinking.   ACTIVITIES OF DAILY LIVING: CONTINENT OF BLADDER/BOWEL? No BATHING/DRESSING/FEEDING: requires assistance with bathing/dressing  MOBILITY:   WHEELCHAIR or bedbound-can roll with feet, uses right hand to pull chair forward  APPETITE? good WEIGHT: last weight 86 lbs on 04/13/22  CURRENT PROBLEM LIST:  Patient Active Problem List   Diagnosis Date Noted   Dysphagia 05/04/2022   Palliative care encounter 05/04/2022   Dementia (HCC) 01/12/2021   Cognitive changes 01/10/2021   Influenza A 05/10/2017   COPD with acute exacerbation (HCC) 05/09/2017   Sepsis (HCC) 05/09/2017   Asthma, chronic, unspecified asthma severity, with acute exacerbation 05/09/2017   Asthma exacerbation 05/09/2017   PAST MEDICAL HISTORY:  Active Ambulatory Problems    Diagnosis Date Noted   COPD with acute exacerbation (HCC) 05/09/2017   Sepsis (HCC) 05/09/2017  Asthma, chronic, unspecified asthma severity, with acute exacerbation 05/09/2017   Asthma exacerbation 05/09/2017   Influenza A 05/10/2017   Cognitive changes 01/10/2021   Dementia (HCC) 01/12/2021   Dysphagia 05/04/2022   Palliative care encounter 05/04/2022   Resolved Ambulatory Problems    Diagnosis Date Noted   No Resolved Ambulatory Problems   Past Medical History:  Diagnosis Date    Diabetes mellitus without complication (HCC)    Hypertension    Speech abnormality    SOCIAL HX:  Social History   Tobacco Use   Smoking status: Every Day    Packs/day: .5    Types: Cigarettes   Smokeless tobacco: Never  Substance Use Topics   Alcohol use: Never    Comment: only when I'm hurting or stressed out.   FAMILY HX:  Family History  Problem Relation Age of Onset   Kidney disease Mother    Diabetes Mother    Hypertension Mother    Stroke Father        Preferred Pharmacy: ALLERGIES: No Known Allergies   PERTINENT MEDICATIONS:  Outpatient Encounter Medications as of 07/30/2022  Medication Sig   albuterol (PROVENTIL HFA;VENTOLIN HFA) 108 (90 Base) MCG/ACT inhaler Inhale 2 puffs into the lungs every 6 (six) hours as needed for wheezing.   Cholecalciferol (VITAMIN D3) 50 MCG (2000 UT) capsule Take 2,000 Units by mouth daily.   donepezil (ARICEPT) 10 MG tablet Take 10 mg by mouth daily.   ipratropium-albuterol (DUONEB) 0.5-2.5 (3) MG/3ML SOLN Take 3 mLs by nebulization.   mometasone-formoterol (DULERA) 200-5 MCG/ACT AERO Inhale 2 puffs into the lungs 2 (two) times daily.   sertraline (ZOLOFT) 50 MG tablet Take 75 mg by mouth daily.   TRANSDERM-SCOP 1 MG/3DAYS Place 1 patch onto the skin every 3 (three) days.   No facility-administered encounter medications on file as of 07/30/2022.    History obtained from review of EMR, discussion with facility staff/caregiver and/or patient.   Lipid Panel     Component Value Date/Time   CHOL 213 (H) 04/13/2022 1502   TRIG 135 04/13/2022 1502   HDL 75 04/13/2022 1502   CHOLHDL 2.8 04/13/2022 1502   LDLCALC 115 (H) 04/13/2022 1502   LABVLDL 23 04/13/2022 1502      I reviewed available labs, medications, imaging, studies and related documents from the EMR.  Records reviewed and summarized above.   Physical Exam: GENERAL: NAD LUNGS: CTAB, no increased work of breathing, room air CARDIAC:  S1S2, slow regular rate with no  MRG, No edema/cyanosis ABD:  Normo-active BS x 4 quads, soft, non-tender EXTREMITIES: No deformity, strength equal but weaker in BLE, Yes muscle atrophy/subcutaneous fat loss generally NEURO:  BLE weakness, noted cognitive impairment-responds with yes or no nodding of her head but does not offer any speech.   PSYCH:  non-anxious affect, A & O at least to name  Thank you for the opportunity to participate in the care of [facility]. Please call our main office at [phone] if we can be of additional assistance.    Joycelyn Man FNP-C  [facility]  Phone:  [phone]

## 2022-11-15 ENCOUNTER — Encounter: Payer: Self-pay | Admitting: Psychiatry

## 2022-11-15 ENCOUNTER — Ambulatory Visit (INDEPENDENT_AMBULATORY_CARE_PROVIDER_SITE_OTHER): Payer: Medicaid Other | Admitting: Psychiatry

## 2022-11-15 VITALS — BP 149/75 | HR 48

## 2022-11-15 DIAGNOSIS — I639 Cerebral infarction, unspecified: Secondary | ICD-10-CM | POA: Diagnosis not present

## 2022-11-15 NOTE — Progress Notes (Signed)
   CC:  vascular dementia  Follow-up Visit  Last visit: 04/13/22  Brief HPI: 57 year old female with a history of COPD, asthma, DM, TBI in 1999 who follows in clinic for dementia. MRI brain 01/10/21 with extensive confluence FLAIR signal abnormality, multiple remote lacunar infarcts and moderate volume loss (more than expected for age), and scattered microhemorrhages. Neuropsychological testing in June 2023 showed profound cognitive deficits, with a pattern consistent with vascular dementia.   Interval History: No new concerns today. She is tolerating her medications well without side effects. She has some mild increase in irritability but is not violent and there are no concerns for her safety. Has some trouble transferring from her bed to her wheelchair and is receiving PT at her nursing home.   Physical Exam:   Vital Signs: BP (!) 149/75   Pulse (!) 48  GENERAL:  well appearing, in no acute distress, alert  SKIN:  Color, texture, turgor normal. No rashes or lesions HEAD:  Normocephalic/atraumatic. RESP: normal respiratory effort MSK:  No gross joint deformities.   NEUROLOGICAL: Mental Status: "yes" or "no" responses only. Follows one-step commands. Unable to complete MOCA or MMSE. Cranial Nerves: PERRL, face symmetric, no dysarthria, hearing grossly intact Motor: moves all extremities equally Gait: uses wheelchair  IMPRESSION: 57 year old female with a history of  COPD, asthma, DM, TBI in 1999 who presents for follow up of vascular dementia. She is tolerating her medications well without issues. Will continue donepezil and Namenda for memory, and Lipitor/ASA for stroke prevention. Will check lipid panel today to see if statin dose needs to be adjusted.  PLAN: -Continue donepezil 10 mg daily and Namenda 10 mg BID -Continue atorvastatin 40 mg daily and ASA 81 mg daily for stroke prevention -Lipid panel today  Follow-up: 6 months  I spent a total of 17 minutes on the date of  the service. Discussed medication side effects, adverse reactions and drug interactions. Written educational materials and patient instructions outlining all of the above were given.  Ocie Doyne, MD 11/15/22 3:13 PM

## 2022-11-17 ENCOUNTER — Telehealth: Payer: Self-pay | Admitting: Psychiatry

## 2022-11-17 NOTE — Telephone Encounter (Signed)
Pt's daughter is asking if there has been any confirmation received from [facility] nursing that the lab work has been done for pt.  Daughter is asking so she will know if she needs to cancel pt's lab appointment here for tomorrow, please call her.

## 2022-11-17 NOTE — Telephone Encounter (Signed)
Called and spoke with Shanda Bumps (ADON) at [facility] nursing and was informed pt had drawn labs yesterday, but not an lipid profile.  Lipid profile will drawn today and faxed to office. Daughter informed of all this, lab appointment canceled.

## 2022-11-17 NOTE — Telephone Encounter (Signed)
Carla [name] from [facility] called needing to speak to an RN regarding the Lab order that was put in for the pt. Please advise.

## 2022-11-17 NOTE — Telephone Encounter (Signed)
Called and spoke with [facility] and pt was seen on 11/15/22 was not able to get labs drawn (lipid profile) that day due to dehydration. Neesy said lab can be drawn at [facility]. Neesy asked I fax order to [phone], order faxed and confirmation received . I did confirm with Joni Reining in lab that labs was not able to be drawn at visit on 11/15/22.

## 2022-11-18 ENCOUNTER — Other Ambulatory Visit: Payer: Medicaid Other

## 2022-12-23 ENCOUNTER — Telehealth: Payer: Self-pay | Admitting: Psychiatry

## 2022-12-23 NOTE — Telephone Encounter (Signed)
Sent updated appt info in mail informing pt of appt time change for 06/01/23 appt due to a scheduling error

## 2023-02-18 ENCOUNTER — Other Ambulatory Visit: Payer: Self-pay

## 2023-02-18 ENCOUNTER — Inpatient Hospital Stay (HOSPITAL_BASED_OUTPATIENT_CLINIC_OR_DEPARTMENT_OTHER)
Admission: EM | Admit: 2023-02-18 | Discharge: 2023-02-22 | DRG: 480 | Disposition: A | Discharge status: Skilled Nursing Facility | Payer: Medicaid Other | Source: Skilled Nursing Facility | Attending: Family Medicine | Admitting: Family Medicine

## 2023-02-18 ENCOUNTER — Encounter (HOSPITAL_BASED_OUTPATIENT_CLINIC_OR_DEPARTMENT_OTHER): Payer: Self-pay | Admitting: Student

## 2023-02-18 ENCOUNTER — Ambulatory Visit (HOSPITAL_BASED_OUTPATIENT_CLINIC_OR_DEPARTMENT_OTHER): Payer: Medicaid Other | Admitting: Student

## 2023-02-18 ENCOUNTER — Telehealth (HOSPITAL_BASED_OUTPATIENT_CLINIC_OR_DEPARTMENT_OTHER): Payer: Self-pay | Admitting: Student

## 2023-02-18 ENCOUNTER — Encounter (HOSPITAL_BASED_OUTPATIENT_CLINIC_OR_DEPARTMENT_OTHER): Payer: Self-pay

## 2023-02-18 ENCOUNTER — Telehealth (HOSPITAL_BASED_OUTPATIENT_CLINIC_OR_DEPARTMENT_OTHER): Payer: Self-pay

## 2023-02-18 ENCOUNTER — Telehealth: Payer: Self-pay

## 2023-02-18 ENCOUNTER — Ambulatory Visit (HOSPITAL_BASED_OUTPATIENT_CLINIC_OR_DEPARTMENT_OTHER): Payer: Medicaid Other

## 2023-02-18 ENCOUNTER — Emergency Department (HOSPITAL_BASED_OUTPATIENT_CLINIC_OR_DEPARTMENT_OTHER): Payer: Medicaid Other

## 2023-02-18 DIAGNOSIS — I10 Essential (primary) hypertension: Secondary | ICD-10-CM | POA: Diagnosis present

## 2023-02-18 DIAGNOSIS — Z7951 Long term (current) use of inhaled steroids: Secondary | ICD-10-CM

## 2023-02-18 DIAGNOSIS — R1310 Dysphagia, unspecified: Secondary | ICD-10-CM | POA: Diagnosis present

## 2023-02-18 DIAGNOSIS — S7222XA Displaced subtrochanteric fracture of left femur, initial encounter for closed fracture: Secondary | ICD-10-CM | POA: Diagnosis present

## 2023-02-18 DIAGNOSIS — J449 Chronic obstructive pulmonary disease, unspecified: Secondary | ICD-10-CM | POA: Diagnosis not present

## 2023-02-18 DIAGNOSIS — R636 Underweight: Secondary | ICD-10-CM | POA: Diagnosis present

## 2023-02-18 DIAGNOSIS — E119 Type 2 diabetes mellitus without complications: Secondary | ICD-10-CM | POA: Diagnosis present

## 2023-02-18 DIAGNOSIS — W1830XA Fall on same level, unspecified, initial encounter: Secondary | ICD-10-CM | POA: Diagnosis present

## 2023-02-18 DIAGNOSIS — J4489 Other specified chronic obstructive pulmonary disease: Secondary | ICD-10-CM | POA: Diagnosis present

## 2023-02-18 DIAGNOSIS — Z66 Do not resuscitate: Secondary | ICD-10-CM | POA: Diagnosis present

## 2023-02-18 DIAGNOSIS — Z90710 Acquired absence of both cervix and uterus: Secondary | ICD-10-CM

## 2023-02-18 DIAGNOSIS — I959 Hypotension, unspecified: Secondary | ICD-10-CM | POA: Diagnosis not present

## 2023-02-18 DIAGNOSIS — Z993 Dependence on wheelchair: Secondary | ICD-10-CM | POA: Diagnosis not present

## 2023-02-18 DIAGNOSIS — Z681 Body mass index (BMI) 19 or less, adult: Secondary | ICD-10-CM | POA: Diagnosis not present

## 2023-02-18 DIAGNOSIS — F0150 Vascular dementia without behavioral disturbance: Secondary | ICD-10-CM | POA: Diagnosis present

## 2023-02-18 DIAGNOSIS — Z79899 Other long term (current) drug therapy: Secondary | ICD-10-CM | POA: Diagnosis not present

## 2023-02-18 DIAGNOSIS — S72002A Fracture of unspecified part of neck of left femur, initial encounter for closed fracture: Principal | ICD-10-CM

## 2023-02-18 DIAGNOSIS — Z823 Family history of stroke: Secondary | ICD-10-CM | POA: Diagnosis not present

## 2023-02-18 DIAGNOSIS — M25552 Pain in left hip: Secondary | ICD-10-CM | POA: Diagnosis not present

## 2023-02-18 DIAGNOSIS — Z841 Family history of disorders of kidney and ureter: Secondary | ICD-10-CM

## 2023-02-18 DIAGNOSIS — J9601 Acute respiratory failure with hypoxia: Secondary | ICD-10-CM | POA: Diagnosis not present

## 2023-02-18 DIAGNOSIS — Y92098 Other place in other non-institutional residence as the place of occurrence of the external cause: Secondary | ICD-10-CM

## 2023-02-18 DIAGNOSIS — J690 Pneumonitis due to inhalation of food and vomit: Secondary | ICD-10-CM | POA: Diagnosis not present

## 2023-02-18 DIAGNOSIS — S72142A Displaced intertrochanteric fracture of left femur, initial encounter for closed fracture: Principal | ICD-10-CM | POA: Diagnosis present

## 2023-02-18 DIAGNOSIS — Z8249 Family history of ischemic heart disease and other diseases of the circulatory system: Secondary | ICD-10-CM

## 2023-02-18 DIAGNOSIS — Z87820 Personal history of traumatic brain injury: Secondary | ICD-10-CM | POA: Diagnosis not present

## 2023-02-18 DIAGNOSIS — Z7401 Bed confinement status: Secondary | ICD-10-CM

## 2023-02-18 DIAGNOSIS — F17210 Nicotine dependence, cigarettes, uncomplicated: Secondary | ICD-10-CM | POA: Diagnosis present

## 2023-02-18 DIAGNOSIS — Z833 Family history of diabetes mellitus: Secondary | ICD-10-CM | POA: Diagnosis not present

## 2023-02-18 LAB — CBC WITH DIFFERENTIAL/PLATELET
Abs Immature Granulocytes: 0.02 10*3/uL (ref 0.00–0.07)
Basophils Absolute: 0 10*3/uL (ref 0.0–0.1)
Basophils Relative: 0 %
Eosinophils Absolute: 0 10*3/uL (ref 0.0–0.5)
Eosinophils Relative: 0 %
HCT: 36.7 % (ref 36.0–46.0)
Hemoglobin: 12.1 g/dL (ref 12.0–15.0)
Immature Granulocytes: 0 %
Lymphocytes Relative: 15 %
Lymphs Abs: 1.5 10*3/uL (ref 0.7–4.0)
MCH: 32.8 pg (ref 26.0–34.0)
MCHC: 33 g/dL (ref 30.0–36.0)
MCV: 99.5 fL (ref 80.0–100.0)
Monocytes Absolute: 0.6 10*3/uL (ref 0.1–1.0)
Monocytes Relative: 6 %
Neutro Abs: 7.5 10*3/uL (ref 1.7–7.7)
Neutrophils Relative %: 79 %
Platelets: 259 10*3/uL (ref 150–400)
RBC: 3.69 MIL/uL — ABNORMAL LOW (ref 3.87–5.11)
RDW: 13.6 % (ref 11.5–15.5)
WBC: 9.7 10*3/uL (ref 4.0–10.5)
nRBC: 0 % (ref 0.0–0.2)

## 2023-02-18 LAB — COMPREHENSIVE METABOLIC PANEL
ALT: 12 U/L (ref 0–44)
AST: 19 U/L (ref 15–41)
Albumin: 3.9 g/dL (ref 3.5–5.0)
Alkaline Phosphatase: 64 U/L (ref 38–126)
Anion gap: 10 (ref 5–15)
BUN: 11 mg/dL (ref 6–20)
CO2: 31 mmol/L (ref 22–32)
Calcium: 9.9 mg/dL (ref 8.9–10.3)
Chloride: 97 mmol/L — ABNORMAL LOW (ref 98–111)
Creatinine, Ser: 0.49 mg/dL (ref 0.44–1.00)
GFR, Estimated: >60 mL/min (ref >60)
Glucose, Bld: 138 mg/dL — ABNORMAL HIGH (ref 70–99)
Potassium: 3.9 mmol/L (ref 3.5–5.1)
Sodium: 138 mmol/L (ref 135–145)
Total Bilirubin: 0.7 mg/dL (ref <1.2)
Total Protein: 8.1 g/dL (ref 6.5–8.1)

## 2023-02-18 MED ORDER — CHLORHEXIDINE GLUCONATE 4 % EX SOLN: 60.0000 mL | Freq: Once | CUTANEOUS | Status: DC

## 2023-02-18 MED ORDER — CEFAZOLIN SODIUM-DEXTROSE 2-4 GM/100ML-% IV SOLN
2.0000 g | INTRAVENOUS | Status: AC
  Administered 2023-02-19: 2 g via INTRAVENOUS

## 2023-02-18 MED ORDER — LACTATED RINGERS IV SOLN: INTRAVENOUS | Status: AC
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
## 2023-02-18 NOTE — ED Provider Notes (Signed)
[place] EMERGENCY DEPARTMENT AT [facility] Provider Note   CSN: [id number] Arrival date & time: 02/18/23  1417     History  Chief Complaint  Patient presents with   Hip Injury    Left Hip    Carla [name] is a 57 y.o. female.  Pt sent by doctor for left broken hip, fell 4 days ago, mostly wheelchair bound, but will transfer to wheelchair. Hx of dementia, COP, DM. Pt denies pain elsewhere. No recent falls other than one 4 days ago, hip pain for a while however. No blood thinners. No n/v/d. No pain if resting. No weakness numbness tingling.   The history is provided by the patient and a caregiver.       Home Medications Prior to Admission medications   Medication Sig Start Date End Date Taking? Authorizing Provider  albuterol (PROVENTIL HFA;VENTOLIN HFA) 108 (90 Base) MCG/ACT inhaler Inhale 2 puffs into the lungs every 6 (six) hours as needed for wheezing. 05/11/17   Rai, Delene Ruffini, MD  Cholecalciferol (VITAMIN D3) 50 MCG (2000 UT) capsule Take 2,000 Units by mouth daily.    [provider]  donepezil (ARICEPT) 10 MG tablet Take 10 mg by mouth daily.    [provider]  ipratropium-albuterol (DUONEB) 0.5-2.5 (3) MG/3ML SOLN Take 3 mLs by nebulization.    [provider]  mometasone-formoterol (DULERA) 200-5 MCG/ACT AERO Inhale 2 puffs into the lungs 2 (two) times daily. 05/11/17   Rai, Delene Ruffini, MD  sertraline (ZOLOFT) 50 MG tablet Take 75 mg by mouth daily. 03/16/21   [provider]  TRANSDERM-SCOP 1 MG/3DAYS Place 1 patch onto the skin every 3 (three) days.    [provider]      Allergies    Patient has no known allergies.    Review of Systems   Review of Systems  Physical Exam Updated Vital Signs BP (!) 167/95   Pulse 75   Temp 98.3 F (36.8 C) (Oral)   Resp (!) 24   Ht 5\' 2"  (1.575 m)   Wt 40 kg   SpO2 96%   BMI 16.13 kg/m  Physical Exam Vitals and nursing note reviewed.  Constitutional:       General: She is not in acute distress.    Appearance: She is well-developed.  HENT:     Head: Normocephalic and atraumatic.     Mouth/Throat:     Mouth: Mucous membranes are moist.  Eyes:     Extraocular Movements: Extraocular movements intact.     Conjunctiva/sclera: Conjunctivae normal.     Pupils: Pupils are equal, round, and reactive to light.  Cardiovascular:     Rate and Rhythm: Normal rate and regular rhythm.     Pulses: Normal pulses.     Heart sounds: Normal heart sounds. No murmur heard. Pulmonary:     Effort: Pulmonary effort is normal. No respiratory distress.     Breath sounds: Normal breath sounds.  Abdominal:     Palpations: Abdomen is soft.     Tenderness: There is no abdominal tenderness.  Musculoskeletal:        General: Tenderness (ttp left hip) present. No swelling.     Cervical back: Neck supple.  Skin:    General: Skin is warm and dry.     Capillary Refill: Capillary refill takes less than 2 seconds.  Neurological:     General: No focal deficit present.     Mental Status: She is alert.     Cranial Nerves:  No cranial nerve deficit.     Sensory: No sensory deficit.     Motor: No weakness.  Psychiatric:        Mood and Affect: Mood normal.     ED Results / Procedures / Treatments   Labs (all labs ordered are listed, but only abnormal results are displayed) Labs Reviewed  CBC WITH DIFFERENTIAL/PLATELET - Abnormal; Notable for the following components:      Result Value   RBC 3.69 (*)    All other components within normal limits  COMPREHENSIVE METABOLIC PANEL - Abnormal; Notable for the following components:   Chloride 97 (*)    Glucose, Bld 138 (*)    All other components within normal limits    EKG EKG Interpretation Date/Time:  Friday February 18 2023 16:13:12 EST Ventricular Rate:  72 PR Interval:  119 QRS Duration:  83 QT Interval:  377 QTC Calculation: 413 R Axis:   99  Text Interpretation: Sinus rhythm Confirmed by Virgina Norfolk  [phone]) on 02/18/2023 7:24:13 PM  Radiology DG HIP UNILAT WITH PELVIS 2-3 VIEWS LEFT  Result Date: 02/18/2023 CLINICAL DATA:  Left hip pain.  Fall approximately 1 month ago. EXAM: DG HIP (WITH OR WITHOUT PELVIS) 2-3V LEFT COMPARISON:  None Available. FINDINGS: There is comminuted and angulated left proximal femur intertrochanteric fracture. No other acute fracture or dislocation. No aggressive osseous lesion. Visualized sacral arcuate lines are unremarkable. Unremarkable symphysis pubis. There are mild degenerative changes of bilateral hip joints without significant joint space narrowing. Osteophytosis of the superior acetabulum. No radiopaque foreign bodies. IMPRESSION: *Intertrochanteric left femur fracture. Electronically Signed   By: Jules Schick M.D.   On: 02/18/2023 15:28   DG Chest Portable 1 View  Result Date: 02/18/2023 CLINICAL DATA:  Fall. EXAM: PORTABLE CHEST 1 VIEW COMPARISON:  12/28/2021. FINDINGS: Bilateral lung fields are clear. Bilateral costophrenic angles are clear. Normal cardio-mediastinal silhouette. No acute osseous abnormalities.  No displaced rib fracture seen. The soft tissues are within normal limits. IMPRESSION: *No active disease. Electronically Signed   By: Jules Schick M.D.   On: 02/18/2023 15:26    Procedures Procedures    Medications Ordered in ED Medications - No data to display  ED Course/ Medical Decision Making/ A&P                                 Medical Decision Making Risk Decision regarding hospitalization.   Carla [name] is here with left hip fracture from orthopedic office.  Patient with dementia.  Fall maybe 4 days ago.  Has not had any falls since.  She is not complaining of any headache or neck pain or other extremity pain.  She does transfer with support to wheelchairs but does not ambulate at baseline with walker at all.  Overall x-ray per my review interpretation does show left intertrochanteric hip fracture.  Will talk with  orthopedic team.  Medical screening labs have been ordered which were unremarkable per my review and interpretation.  EKG shows sinus rhythm per my review and interpretation.  Chest x-ray with no evidence of pneumothorax or rib fractures.  Medical screening labs are unremarkable per my review and interpretation.  I talked with Dr. Odis Hollingshead orthopedics.  Patient to be n.p.o. at midnight and admit to [facility].  Plan for hopefully surgery tomorrow.  This chart was dictated using voice recognition software.  Despite best efforts to proofread,  errors can occur which can change  the documentation meaning.         Final Clinical Impression(s) / ED Diagnoses Final diagnoses:  Closed fracture of left hip, initial encounter [facility])    Rx / DC Orders ED Discharge Orders     None         Virgina Norfolk, DO 02/18/23 1954

## 2023-02-18 NOTE — ED Notes (Signed)
Dr. Lockie Mola cleaned patient to drink at this time.

## 2023-02-18 NOTE — Telephone Encounter (Signed)
Dr.Curatolo at [facility] needs you to call him regarding patient. Call back #[phone]

## 2023-02-18 NOTE — Plan of Care (Signed)
Plan of Care Note for accepted transfer   Patient name: Carla [name] AVW:[id number] DOB: [birth year]  Facility requesting transfer: [facility] ED Requesting Provider: Dr. Lockie Mola Facility course: 57 year old female with history of asthma/COPD, diabetes, hypertension, vascular dementia seen by Ortho today for left hip pain in setting of a fall at her nursing facility 5 days ago.  X-ray revealed intertrochanteric fracture of the left hip and she was sent to the ED to be admitted for surgical management.  ED PA spoke to Dr. Odis Hollingshead with orthopedics, keep n.p.o. at midnight and admit to [facility] for surgery tomorrow.  Chest x-ray showing no active disease.  No significant abnormalities on CBC and CMP.  Plan of care: The patient is accepted for admission to Med-surg  unit at [facility].  [facility] will assume care on arrival to accepting facility. Until arrival, care as per EDP. However, [facility] available 24/7 for questions and assistance.  Check [email or web address] for on-call coverage.  Nursing staff, please call [facility] Admits & Consults System-Wide number under Amion on patient's arrival so appropriate admitting provider can evaluate the pt.

## 2023-02-18 NOTE — Care Plan (Signed)
Orthopaedic Surgery Plan of Care Note   -history and imaging reviewed with primary team (ER) -pt has displaced left intertroch hip fx x 4 days -PMH includes dementia, COPD, DM -admit to Hospital Medicine team -please keep NPO and hold VTE ppx -plan for left femur IMN by Dr. Duwayne Heck 02/19/23 -full consult note to follow   Carla Cedars, MD Orthopaedic Surgery [facility]

## 2023-02-18 NOTE — ED Notes (Signed)
EKG done

## 2023-02-18 NOTE — Telephone Encounter (Signed)
Lvm for daughter to cb to let her know pt is being transfer to hospital for surgery on left hip fracture
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
## 2023-02-18 NOTE — Telephone Encounter (Signed)
Called daughter with no success called son left a message for someone to call back @[phone]  because his sister will be admitted to hospital today for emerg surgery Due to a fall 5 days ago at [facility]

## 2023-02-18 NOTE — ED Triage Notes (Signed)
Patient arrives from doctors office due to being diagnosed for positive left hip fracture (hip pain x1 month).  Patient is a resident at [facility] in [place].

## 2023-02-19 ENCOUNTER — Inpatient Hospital Stay (HOSPITAL_COMMUNITY): Payer: Medicaid Other | Admitting: Anesthesiology

## 2023-02-19 ENCOUNTER — Encounter (HOSPITAL_COMMUNITY)
Admission: EM | Disposition: A | Discharge status: Skilled Nursing Facility | Payer: Self-pay | Source: Skilled Nursing Facility | Attending: Family Medicine

## 2023-02-19 ENCOUNTER — Inpatient Hospital Stay (HOSPITAL_COMMUNITY): Payer: Medicaid Other

## 2023-02-19 ENCOUNTER — Other Ambulatory Visit: Payer: Self-pay

## 2023-02-19 ENCOUNTER — Encounter (HOSPITAL_COMMUNITY): Payer: Self-pay | Admitting: Internal Medicine

## 2023-02-19 DIAGNOSIS — I10 Essential (primary) hypertension: Secondary | ICD-10-CM | POA: Diagnosis not present

## 2023-02-19 DIAGNOSIS — S72142A Displaced intertrochanteric fracture of left femur, initial encounter for closed fracture: Secondary | ICD-10-CM

## 2023-02-19 DIAGNOSIS — J449 Chronic obstructive pulmonary disease, unspecified: Secondary | ICD-10-CM | POA: Diagnosis not present

## 2023-02-19 DIAGNOSIS — F17210 Nicotine dependence, cigarettes, uncomplicated: Secondary | ICD-10-CM | POA: Diagnosis not present

## 2023-02-19 HISTORY — PX: INTRAMEDULLARY (IM) NAIL INTERTROCHANTERIC: SHX5875

## 2023-02-19 LAB — CBC
HCT: 35.4 % — ABNORMAL LOW (ref 36.0–46.0)
Hemoglobin: 11.2 g/dL — ABNORMAL LOW (ref 12.0–15.0)
MCH: 31.5 pg (ref 26.0–34.0)
MCHC: 31.6 g/dL (ref 30.0–36.0)
MCV: 99.7 fL (ref 80.0–100.0)
Platelets: 253 10*3/uL (ref 150–400)
RBC: 3.55 MIL/uL — ABNORMAL LOW (ref 3.87–5.11)
RDW: 13.6 % (ref 11.5–15.5)
WBC: 14.5 10*3/uL — ABNORMAL HIGH (ref 4.0–10.5)
nRBC: 0 % (ref 0.0–0.2)

## 2023-02-19 LAB — HIV ANTIBODY (ROUTINE TESTING W REFLEX): HIV Screen 4th Generation wRfx: NONREACTIVE

## 2023-02-19 LAB — BASIC METABOLIC PANEL
Anion gap: 10 (ref 5–15)
BUN: 10 mg/dL (ref 6–20)
CO2: 29 mmol/L (ref 22–32)
Calcium: 9.8 mg/dL (ref 8.9–10.3)
Chloride: 98 mmol/L (ref 98–111)
Creatinine, Ser: 0.59 mg/dL (ref 0.44–1.00)
GFR, Estimated: >60 mL/min (ref >60)
Glucose, Bld: 144 mg/dL — ABNORMAL HIGH (ref 70–99)
Potassium: 4 mmol/L (ref 3.5–5.1)
Sodium: 137 mmol/L (ref 135–145)

## 2023-02-19 LAB — TYPE AND SCREEN
ABO/RH(D): O POS
Antibody Screen: NEGATIVE

## 2023-02-19 LAB — ABO/RH: ABO/RH(D): O POS

## 2023-02-19 LAB — GLUCOSE, CAPILLARY: Glucose-Capillary: 145 mg/dL — ABNORMAL HIGH (ref 70–99)

## 2023-02-19 LAB — SURGICAL PCR SCREEN
MRSA, PCR: NEGATIVE
Staphylococcus aureus: NEGATIVE

## 2023-02-19 SURGERY — FIXATION, FRACTURE, INTERTROCHANTERIC, WITH INTRAMEDULLARY ROD
Anesthesia: General | Laterality: Left

## 2023-02-19 MED ORDER — MIDAZOLAM HCL 2 MG/2ML IJ SOLN
INTRAMUSCULAR | Status: AC
  Filled 2023-02-19: qty 2

## 2023-02-19 MED ORDER — 0.9 % SODIUM CHLORIDE (POUR BTL) OPTIME
TOPICAL | Status: DC | PRN
  Administered 2023-02-19: 100 mL

## 2023-02-19 MED ORDER — ACETAMINOPHEN 10 MG/ML IV SOLN
INTRAVENOUS | Status: AC
  Filled 2023-02-19: qty 100

## 2023-02-19 MED ORDER — MOMETASONE FURO-FORMOTEROL FUM 200-5 MCG/ACT IN AERO
2.0000 | INHALATION_SPRAY | Freq: Two times a day (BID) | RESPIRATORY_TRACT | Status: DC
Start: 2023-02-19 — End: 2023-02-22
  Administered 2023-02-20 – 2023-02-22 (×5): 2 via RESPIRATORY_TRACT
  Filled 2023-02-19: qty 8.8

## 2023-02-19 MED ORDER — CHLORHEXIDINE GLUCONATE 0.12 % MT SOLN
OROMUCOSAL | Status: AC
  Administered 2023-02-19: 15 mL via OROMUCOSAL
  Filled 2023-02-19: qty 15

## 2023-02-19 MED ORDER — MORPHINE SULFATE (PF) 2 MG/ML IV SOLN: 0.5000 mg | INTRAVENOUS | Status: DC | PRN

## 2023-02-19 MED ORDER — ONDANSETRON HCL 4 MG/2ML IJ SOLN
INTRAMUSCULAR | Status: DC | PRN
  Administered 2023-02-19: 4 mg via INTRAVENOUS

## 2023-02-19 MED ORDER — SERTRALINE HCL 50 MG PO TABS
75.0000 mg | ORAL_TABLET | Freq: Every day | ORAL | Status: DC
  Administered 2023-02-20 – 2023-02-22 (×3): 75 mg via ORAL
  Filled 2023-02-19 (×3): qty 1

## 2023-02-19 MED ORDER — HYDROCODONE-ACETAMINOPHEN 5-325 MG PO TABS
1.0000 | ORAL_TABLET | Freq: Four times a day (QID) | ORAL | Status: DC | PRN
  Filled 2023-02-19: qty 1

## 2023-02-19 MED ORDER — CHLORHEXIDINE GLUCONATE 0.12 % MT SOLN: 15.0000 mL | Freq: Once | OROMUCOSAL | Status: AC

## 2023-02-19 MED ORDER — CEFAZOLIN SODIUM-DEXTROSE 2-4 GM/100ML-% IV SOLN
INTRAVENOUS | Status: AC
  Filled 2023-02-19: qty 100

## 2023-02-19 MED ORDER — METOCLOPRAMIDE HCL 5 MG/ML IJ SOLN: 5.0000 mg | Freq: Three times a day (TID) | INTRAMUSCULAR | Status: DC | PRN

## 2023-02-19 MED ORDER — ACETAMINOPHEN 10 MG/ML IV SOLN
INTRAVENOUS | Status: DC | PRN
  Administered 2023-02-19: 600 mg via INTRAVENOUS

## 2023-02-19 MED ORDER — MIDAZOLAM HCL 2 MG/2ML IJ SOLN
INTRAMUSCULAR | Status: DC | PRN
  Administered 2023-02-19: 1 mg via INTRAVENOUS

## 2023-02-19 MED ORDER — DONEPEZIL HCL 10 MG PO TABS
10.0000 mg | ORAL_TABLET | Freq: Every day | ORAL | Status: DC
  Administered 2023-02-20 – 2023-02-22 (×3): 10 mg via ORAL
  Filled 2023-02-19 (×3): qty 1

## 2023-02-19 MED ORDER — SUGAMMADEX SODIUM 200 MG/2ML IV SOLN
INTRAVENOUS | Status: DC | PRN
  Administered 2023-02-19: 200 mg via INTRAVENOUS

## 2023-02-19 MED ORDER — DOCUSATE SODIUM 100 MG PO CAPS
100.0000 mg | ORAL_CAPSULE | Freq: Two times a day (BID) | ORAL | Status: DC
  Administered 2023-02-19: 100 mg via ORAL
  Filled 2023-02-19 (×4): qty 1

## 2023-02-19 MED ORDER — ASPIRIN 325 MG PO TBEC
325.0000 mg | DELAYED_RELEASE_TABLET | Freq: Every day | ORAL | Status: DC
  Administered 2023-02-20 – 2023-02-22 (×3): 325 mg via ORAL
  Filled 2023-02-19 (×3): qty 1

## 2023-02-19 MED ORDER — ONDANSETRON HCL 4 MG/2ML IJ SOLN: 4.0000 mg | Freq: Four times a day (QID) | INTRAMUSCULAR | Status: DC | PRN

## 2023-02-19 MED ORDER — PROPOFOL 10 MG/ML IV BOLUS
INTRAVENOUS | Status: AC
  Filled 2023-02-19: qty 20

## 2023-02-19 MED ORDER — ENSURE ENLIVE PO LIQD
237.0000 mL | Freq: Two times a day (BID) | ORAL | Status: DC
  Administered 2023-02-20 – 2023-02-22 (×6): 237 mL via ORAL

## 2023-02-19 MED ORDER — FENTANYL CITRATE (PF) 250 MCG/5ML IJ SOLN
INTRAMUSCULAR | Status: AC
  Filled 2023-02-19: qty 5

## 2023-02-19 MED ORDER — ORAL CARE MOUTH RINSE: 15.0000 mL | Freq: Once | OROMUCOSAL | Status: AC

## 2023-02-19 MED ORDER — DEXAMETHASONE SODIUM PHOSPHATE 10 MG/ML IJ SOLN
INTRAMUSCULAR | Status: DC | PRN
  Administered 2023-02-19: 5 mg via INTRAVENOUS

## 2023-02-19 MED ORDER — HYDRALAZINE HCL 20 MG/ML IJ SOLN
INTRAMUSCULAR | Status: DC | PRN
  Administered 2023-02-19: 10 mg via INTRAVENOUS

## 2023-02-19 MED ORDER — PROPOFOL 10 MG/ML IV BOLUS
INTRAVENOUS | Status: DC | PRN
  Administered 2023-02-19: 60 mg via INTRAVENOUS

## 2023-02-19 MED ORDER — HYDRALAZINE HCL 25 MG PO TABS: 25.0000 mg | ORAL_TABLET | Freq: Four times a day (QID) | ORAL | Status: DC | PRN

## 2023-02-19 MED ORDER — FENTANYL CITRATE (PF) 250 MCG/5ML IJ SOLN
INTRAMUSCULAR | Status: DC | PRN
  Administered 2023-02-19: 50 ug via INTRAVENOUS
  Administered 2023-02-19: 25 ug via INTRAVENOUS
  Administered 2023-02-19: 50 ug via INTRAVENOUS

## 2023-02-19 MED ORDER — ROCURONIUM BROMIDE 10 MG/ML (PF) SYRINGE
PREFILLED_SYRINGE | INTRAVENOUS | Status: DC | PRN
  Administered 2023-02-19: 30 mg via INTRAVENOUS

## 2023-02-19 MED ORDER — PHENOL 1.4 % MT LIQD: 1.0000 | OROMUCOSAL | Status: DC | PRN

## 2023-02-19 MED ORDER — METOCLOPRAMIDE HCL 5 MG PO TABS
5.0000 mg | ORAL_TABLET | Freq: Three times a day (TID) | ORAL | Status: DC | PRN
Start: 2023-02-19 — End: 2023-02-22

## 2023-02-19 MED ORDER — CEFAZOLIN SODIUM-DEXTROSE 2-4 GM/100ML-% IV SOLN
2.0000 g | Freq: Four times a day (QID) | INTRAVENOUS | Status: AC
  Administered 2023-02-19 (×2): 2 g via INTRAVENOUS
  Filled 2023-02-19 (×2): qty 100

## 2023-02-19 MED ORDER — HYDRALAZINE HCL 20 MG/ML IJ SOLN
INTRAMUSCULAR | Status: AC
  Filled 2023-02-19: qty 1

## 2023-02-19 MED ORDER — TRANEXAMIC ACID-NACL 1000-0.7 MG/100ML-% IV SOLN
1000.0000 mg | Freq: Once | INTRAVENOUS | Status: AC
  Administered 2023-02-19: 1000 mg via INTRAVENOUS
  Filled 2023-02-19: qty 100

## 2023-02-19 MED ORDER — LIDOCAINE 2% (20 MG/ML) 5 ML SYRINGE
INTRAMUSCULAR | Status: DC | PRN
  Administered 2023-02-19: 40 mg via INTRAVENOUS

## 2023-02-19 MED ORDER — ADULT MULTIVITAMIN W/MINERALS CH
1.0000 | ORAL_TABLET | Freq: Every day | ORAL | Status: DC
  Administered 2023-02-20 – 2023-02-22 (×3): 1 via ORAL
  Filled 2023-02-19 (×3): qty 1

## 2023-02-19 MED ORDER — LACTATED RINGERS IV SOLN: INTRAVENOUS | Status: DC

## 2023-02-19 MED ORDER — DROPERIDOL 2.5 MG/ML IJ SOLN: 0.6250 mg | Freq: Once | INTRAMUSCULAR | Status: DC | PRN

## 2023-02-19 MED ORDER — ALBUTEROL SULFATE (2.5 MG/3ML) 0.083% IN NEBU: 3.0000 mL | INHALATION_SOLUTION | Freq: Four times a day (QID) | RESPIRATORY_TRACT | Status: DC | PRN

## 2023-02-19 MED ORDER — HYDROMORPHONE HCL 1 MG/ML IJ SOLN: 0.2500 mg | INTRAMUSCULAR | Status: DC | PRN

## 2023-02-19 MED ORDER — PHENYLEPHRINE 80 MCG/ML (10ML) SYRINGE FOR IV PUSH (FOR BLOOD PRESSURE SUPPORT)
PREFILLED_SYRINGE | INTRAVENOUS | Status: DC | PRN
  Administered 2023-02-19: 120 ug via INTRAVENOUS
  Administered 2023-02-19 (×2): 80 ug via INTRAVENOUS

## 2023-02-19 MED ORDER — MENTHOL 3 MG MT LOZG: 1.0000 | LOZENGE | OROMUCOSAL | Status: DC | PRN

## 2023-02-19 MED ORDER — SENNOSIDES-DOCUSATE SODIUM 8.6-50 MG PO TABS: 1.0000 | ORAL_TABLET | Freq: Every evening | ORAL | Status: DC | PRN

## 2023-02-19 SURGICAL SUPPLY — 32 items
ALCOHOL 70% 16 OZ (MISCELLANEOUS) ×1 IMPLANT
BIT DRILL 4.0X280 (BIT) IMPLANT
BNDG COHESIVE 6X5 TAN ST LF (GAUZE/BANDAGES/DRESSINGS) ×2 IMPLANT
COVER PERINEAL POST (MISCELLANEOUS) ×1 IMPLANT
COVER SURGICAL LIGHT HANDLE (MISCELLANEOUS) ×1 IMPLANT
DRAPE C-ARM 42X72 X-RAY (DRAPES) ×1 IMPLANT
DRAPE INCISE IOBAN 66X45 STRL (DRAPES) ×1 IMPLANT
DRAPE STERI IOBAN 125X83 (DRAPES) ×1 IMPLANT
DRSG ADAPTIC 3X8 NADH LF (GAUZE/BANDAGES/DRESSINGS) ×1 IMPLANT
DRSG AQUACEL AG ADV 3.5X 4 (GAUZE/BANDAGES/DRESSINGS) IMPLANT
DRSG EMULSION OIL 3X3 NADH (GAUZE/BANDAGES/DRESSINGS) IMPLANT
DURAPREP 26ML APPLICATOR (WOUND CARE) ×1 IMPLANT
ELECT CAUTERY BLADE 6.4 (BLADE) ×1 IMPLANT
ELECT REM PT RETURN 9FT ADLT (ELECTROSURGICAL) ×1
ELECTRODE REM PT RTRN 9FT ADLT (ELECTROSURGICAL) ×1 IMPLANT
GLOVE BIO SURGEON STRL SZ7.5 (GLOVE) ×2 IMPLANT
GLOVE BIOGEL PI IND STRL 8 (GLOVE) ×2 IMPLANT
GOWN STRL REUS W/ TWL LRG LVL3 (GOWN DISPOSABLE) ×1 IMPLANT
GOWN STRL REUS W/ TWL XL LVL3 (GOWN DISPOSABLE) ×2 IMPLANT
KIT BASIN OR (CUSTOM PROCEDURE TRAY) ×1 IMPLANT
KIT TURNOVER KIT B (KITS) ×1 IMPLANT
NAIL IM TROC SHORT 9X20 (Nail) IMPLANT
NS IRRIG 1000ML POUR BTL (IV SOLUTION) ×1 IMPLANT
PACK GENERAL/GYN (CUSTOM PROCEDURE TRAY) ×1 IMPLANT
PAD ARMBOARD 7.5X6 YLW CONV (MISCELLANEOUS) ×2 IMPLANT
PIN GUIDE THRD AR 3.2X330 (PIN) IMPLANT
SCREW CORT CAPTR 5X34 (Screw) IMPLANT
SCREW TELESC LAG 10.5X80 (Screw) IMPLANT
STAPLER VISISTAT 35W (STAPLE) ×1 IMPLANT
SUT MON AB 2-0 CT1 36 (SUTURE) ×1 IMPLANT
TOOL ACTIVATION (INSTRUMENTS) IMPLANT
TOWEL GREEN STERILE (TOWEL DISPOSABLE) ×1 IMPLANT
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
## 2023-02-19 NOTE — Op Note (Signed)
Date of Surgery: 02/19/2023  INDICATIONS: Ms. Carla [name] is a 57 y.o.-year-old female who sustained a left hip fracture. The risks and benefits of the procedure discussed with the patient and family prior to the procedure and all questions were answered; consent was obtained.  PREOPERATIVE DIAGNOSIS: left hip fracture   POSTOPERATIVE DIAGNOSIS: Same   PROCEDURE: Treatment of intertrochanteric, pertrochanteric, subtrochanteric fracture with intramedullary implant. CPT [phone]   SURGEON: Kathi Der. Aundria Rud, M.D.   Assistant:  Sharon Seller, PA-C  Assistant attestation:  PA Sharon Seller present for the entire procedure.  ANESTHESIA: general   IV FLUIDS AND URINE: See anesthesia record   ESTIMATED BLOOD LOSS: 20 cc  IMPLANTS:   Arthrex 9 mm short nail with a 80 mm compression screw and a distal interlock x 1  DRAINS: None.   COMPLICATIONS: None.   DESCRIPTION OF PROCEDURE: The patient was brought to the operating room and placed supine on the operating table. The patient's leg had been signed prior to the procedure. The patient had the anesthesia placed by the anesthesiologist. The prep verification and incision time-outs were performed to confirm that this was the correct patient, site, side and location. The patient had an SCD on the opposite lower extremity. The patient did receive antibiotics prior to the incision and was re-dosed during the procedure as needed at indicated intervals. The patient was positioned on the fracture table with the table in traction and internal rotation to reduce the hip. The well leg was placed in a scissor position and all bony prominences were well-padded. The patient had the lower extremity prepped and draped in the standard surgical fashion. The incision was made 4 finger breadths superior to the greater trochanter. A guide pin was inserted into the tip of the greater trochanter under fluoroscopic guidance. An opening reamer was used to gain access to the femoral  canal. The nail length was measured and inserted down the femoral canal to its proper depth. The appropriate version of insertion for the lag screw was found under fluoroscopy. A pin was inserted up the femoral neck through the jig. The length of the lag screw was then measured. The lag screw was inserted as near to center-center in the head as possible. The leg was taken out of traction, then the compression screw was used to compress across the fracture. Compression was visualized on serial xrays.   We next turned our attention to the distal interlocking screw.  This was placed through the drill guide of the nail inserter.  A small incision was made overlying the lateral thigh at the screw site, and a tonsil was used to disect down to bone.  A drill pass was made through the jig and across the nail through both cortices.  This was measured, and the appropriate screw was placed under hand power and found to have good bite.    The wound was copiously irrigated with saline and the subcutaneous layer closed with 2.0 vicryl and the skin was reapproximated with staples. The wounds were cleaned and dried a final time and a sterile dressing was placed. The hip was taken through a range of motion at the end of the case under fluoroscopic imaging to visualize the approach-withdraw phenomenon and confirm implant length in the head. The patient was then awakened from anesthesia and taken to the recovery room in stable condition. All counts were correct at the end of the case.   POSTOPERATIVE PLAN: The patient will be weight bearing as tolerated and will return  in 2 weeks for staple removal and the patient will receive DVT prophylaxis based on other medications, activity level, and risk ratio of bleeding to thrombosis.     Carla Rued, MD [facility] [place] [phone] 10:58 AM

## 2023-02-19 NOTE — Anesthesia Procedure Notes (Signed)
Procedure Name: Intubation Date/Time: 02/19/2023 9:08 AM  Performed by: Colbert Coyer, CRNAPre-anesthesia Checklist: Patient identified, Emergency Drugs available, Suction available and Patient being monitored Patient Re-evaluated:Patient Re-evaluated prior to induction Oxygen Delivery Method: Circle System Utilized Preoxygenation: Pre-oxygenation with 100% oxygen Induction Type: IV induction Ventilation: Mask ventilation without difficulty Laryngoscope Size: Mac and 4 Grade View: Grade I Tube type: Oral Tube size: 6.5 mm Number of attempts: 1 Airway Equipment and Method: Stylet and Oral airway Placement Confirmation: ETT inserted through vocal cords under direct vision, positive ETCO2 and breath sounds checked- equal and bilateral Secured at: 21 cm Tube secured with: Tape Dental Injury: Teeth and Oropharynx as per pre-operative assessment

## 2023-02-19 NOTE — Progress Notes (Signed)
[facility]  PROGRESS NOTE  Antione Klepp GLO:[id number] DOB: 1965-09-20 DOA: 02/18/2023 PCP: Patient, No Pcp Per   Brief HPI:   57 y.o. female with medical history significant for vascular dementia, history of CVA, dysarthria, COPD/asthma, TBI in 1999 who is admitted with a left intertrochanteric femur fracture.  History is limited from patient due to dementia and is otherwise supplemented by EDP, chart review, and daughter by phone.  Patient currently resides at [facility] facility.  She reportedly was found on the floor 5 days ago after an apparent unwitnessed fall.  She had been treated for pain with tramadol and Tylenol.  She is confined to bed/wheelchair at baseline.     She was seen in the orthopedic office earlier today for further evaluation.  X-ray was obtained and showed an intertrochanteric fracture of the left hip.  She was subsequently sent to the [facility] ED for further evaluation.    Assessment/Plan:   Left intertrochanteric hip fracture -Occurred after unwitnessed fall at facility -Orthopedics consulted, underwent IM nail today -DVT prophylaxis per orthopedics -PT/OT  COPD/asthma -Stable, no wheezing -Continue Dulera, albuterol as needed  History of CVA -Not on statin or aspirin at home  Vascular dementia -Continue donepezil  Hypertension -Blood pressure has been elevated -Start hydralazine as needed    Medications     [START ON 02/20/2023] aspirin EC  325 mg Oral Q breakfast   docusate sodium  100 mg Oral BID   donepezil  10 mg Oral Daily   [START ON 02/20/2023] feeding supplement  237 mL Oral BID BM   hydrALAZINE       mometasone-formoterol  2 puff Inhalation BID   [START ON 02/20/2023] multivitamin with minerals  1 tablet Oral Daily   sertraline  75 mg Oral Daily     Data Reviewed:   CBG:  Recent Labs  Lab 02/19/23 1032  GLUCAP 145*    SpO2: 100 % O2 Flow Rate (L/min): 6 L/min    Vitals:   02/19/23 1021 02/19/23  1030 02/19/23 1045 02/19/23 1059  BP: (!) 214/109 (!) 190/90 (!) 148/80 136/76  Pulse: 72 79 80 83  Resp: 16 19 20 17   Temp: (!) 97.3 F (36.3 C)  97.6 F (36.4 C)   TempSrc:      SpO2: 100% 100% 100% 100%  Weight:      Height:          Data Reviewed:  Basic Metabolic Panel: Recent Labs  Lab 02/18/23 1525 02/19/23 0537  NA 138 137  K 3.9 4.0  CL 97* 98  CO2 31 29  GLUCOSE 138* 144*  BUN 11 10  CREATININE 0.49 0.59  CALCIUM 9.9 9.8    CBC: Recent Labs  Lab 02/18/23 1525 02/19/23 0537  WBC 9.7 14.5*  NEUTROABS 7.5  --   HGB 12.1 11.2*  HCT 36.7 35.4*  MCV 99.5 99.7  PLT 259 253    LFT Recent Labs  Lab 02/18/23 1525  AST 19  ALT 12  ALKPHOS 64  BILITOT 0.7  PROT 8.1  ALBUMIN 3.9     Antibiotics: Anti-infectives (From admission, onward)    Start     Dose/Rate Route Frequency Ordered Stop   02/19/23 1500  ceFAZolin (ANCEF) IVPB 2g/100 mL premix        2 g 200 mL/hr over 30 Minutes Intravenous Every 6 hours 02/19/23 1109 02/20/23 0259   02/19/23 0807  ceFAZolin (ANCEF) 2-4 GM/100ML-% IVPB       Note to Pharmacy:  [name] D: cabinet override      02/19/23 9147 02/19/23 2014   02/19/23 0600  ceFAZolin (ANCEF) IVPB 2g/100 mL premix        2 g 200 mL/hr over 30 Minutes Intravenous On call to O.R. 02/18/23 2334 02/19/23 0926        DVT prophylaxis: Per orthopedics  Code Status: DNR  Family Communication: Discussed with family number at bedside   CONSULTS    Subjective   Seen status post surgery.  She is nonverbal at baseline   Objective    Physical Examination:   General-appears in no acute distress Heart-S1-S2, regular, no murmur auscultated Lungs-clear to auscultation bilaterally, no wheezing or crackles auscultated Abdomen-soft, nontender, no organomegaly Extremities-no edema in the lower extremities Neuro-alert, nonverbal at baseline, not following commands  Status is: Inpatient:             Meredeth Ide   [facility] If 7PM-7AM, please contact night-coverage at [email or web address], Office  [phone]   02/19/2023, 11:17 AM  LOS: 1 day

## 2023-02-19 NOTE — Transfer of Care (Signed)
Immediate Anesthesia Transfer of Care Note  Patient: Carla [name]  Procedure(s) Performed: INTRAMEDULLARY (IM) NAIL INTERTROCHANTERIC (Left)  Patient Location: PACU  Anesthesia Type:General  Level of Consciousness: drowsy  Airway & Oxygen Therapy: Patient Spontanous Breathing and Patient connected to face mask  Post-op Assessment: Report given to RN and Post -op Vital signs reviewed and stable  Post vital signs: Reviewed and stable  Last Vitals:  Vitals Value Taken Time  BP 214/109 02/19/23 1021  Temp 97.3   Pulse 73 02/19/23 1027  Resp 17 02/19/23 1027  SpO2 100 % 02/19/23 1027  Vitals shown include unfiled device data.  Last Pain:  Vitals:   02/19/23 0811  TempSrc: Oral  PainSc:       Patients Stated Pain Goal: 0 (02/18/23 2249)  Complications: No notable events documented.

## 2023-02-19 NOTE — Anesthesia Preprocedure Evaluation (Addendum)
Anesthesia Evaluation  Patient identified by MRN, date of birth, ID band Patient awake    Reviewed: Allergy & Precautions, NPO status , Patient's Chart, lab work & pertinent test results  Airway Mallampati: II  TM Distance: >3 FB Neck ROM: Full    Dental no notable dental hx. (+) Dental Advisory Given, Teeth Intact   Pulmonary asthma , COPD, Current Smoker   Pulmonary exam normal breath sounds clear to auscultation       Cardiovascular hypertension, Normal cardiovascular exam Rhythm:Regular Rate:Normal     Neuro/Psych  PSYCHIATRIC DISORDERS     Dementia negative neurological ROS     GI/Hepatic negative GI ROS, Neg liver ROS,,,  Endo/Other  diabetes    Renal/GU negative Renal ROS     Musculoskeletal negative musculoskeletal ROS (+)    Abdominal   Peds  Hematology negative hematology ROS (+)   Anesthesia Other Findings   Reproductive/Obstetrics                             Anesthesia Physical Anesthesia Plan  ASA: 3  Anesthesia Plan: General   Post-op Pain Management: Ofirmev IV (intra-op)*   Induction: Intravenous  PONV Risk Score and Plan: 3 and Ondansetron, Dexamethasone and Treatment may vary due to age or medical condition  Airway Management Planned: Oral ETT  Additional Equipment:   Intra-op Plan:   Post-operative Plan: Extubation in OR  Informed Consent: I have reviewed the patients History and Physical, chart, labs and discussed the procedure including the risks, benefits and alternatives for the proposed anesthesia with the patient or authorized representative who has indicated his/her understanding and acceptance.   Patient has DNR.  Discussed DNR with patient and Suspend DNR.   Dental advisory given  Plan Discussed with: CRNA  Anesthesia Plan Comments:        Anesthesia Quick Evaluation

## 2023-02-19 NOTE — Plan of Care (Signed)
  Problem: Education: Goal: Knowledge of General Education information will improve Description: Including pain rating scale, medication(s)/side effects and non-pharmacologic comfort measures Outcome: Not Progressing   Problem: Health Behavior/Discharge Planning: Goal: Ability to manage health-related needs will improve Outcome: Not Progressing   Problem: Clinical Measurements: Goal: Ability to maintain clinical measurements within normal limits will improve Outcome: Not Progressing Goal: Will remain free from infection Outcome: Not Progressing Goal: Diagnostic test results will improve Outcome: Not Progressing Goal: Respiratory complications will improve Outcome: Not Progressing Goal: Cardiovascular complication will be avoided Outcome: Not Progressing   Problem: Activity: Goal: Risk for activity intolerance will decrease Outcome: Not Progressing   Problem: Nutrition: Goal: Adequate nutrition will be maintained Outcome: Not Progressing   Problem: Coping: Goal: Level of anxiety will decrease Outcome: Not Progressing   Problem: Elimination: Goal: Will not experience complications related to bowel motility Outcome: Not Progressing Goal: Will not experience complications related to urinary retention Outcome: Not Progressing   Problem: Pain Management: Goal: General experience of comfort will improve Outcome: Not Progressing   Problem: Safety: Goal: Ability to remain free from injury will improve Outcome: Not Progressing   Problem: Skin Integrity: Goal: Risk for impaired skin integrity will decrease Outcome: Not Progressing   Problem: Education: Goal: Verbalization of understanding the information provided (i.e., activity precautions, restrictions, etc) will improve Outcome: Not Progressing Goal: Individualized Educational Video(s) Outcome: Not Progressing   Problem: Activity: Goal: Ability to ambulate and perform ADLs will improve Outcome: Not Progressing    Problem: Clinical Measurements: Goal: Postoperative complications will be avoided or minimized Outcome: Not Progressing   Problem: Self-Concept: Goal: Ability to maintain and perform role responsibilities to the fullest extent possible will improve Outcome: Not Progressing   Problem: Pain Management: Goal: Pain level will decrease Outcome: Not Progressing

## 2023-02-19 NOTE — Discharge Instructions (Signed)
Orthopedic surgery discharge instructions:  -Okay for weightbearing as tolerated immediately to the left lower extremity.  -Do not submerge incisions underwater.  Apply ice liberally to the incisions throughout the day for 15 to 30 minutes each time.  -For mild to moderate pain use Tylenol and Advil around-the-clock.  For any breakthrough pain use tramadol as necessary.  -

## 2023-02-19 NOTE — Brief Op Note (Signed)
02/19/2023  10:56 AM  PATIENT:  Leotis Pain  57 y.o. female  PRE-OPERATIVE DIAGNOSIS:  Left hip fracture  POST-OPERATIVE DIAGNOSIS:  Left hip fracture  PROCEDURE:  Procedure(s): INTRAMEDULLARY (IM) NAIL INTERTROCHANTERIC (Left)  SURGEON:  Surgeons and Role:    * [name], MD - Primary   ASSISTANTS: Dion Saucier, PA-C  ANESTHESIA:   general  EBL:  20 mL   BLOOD ADMINISTERED:none  DRAINS: none   LOCAL MEDICATIONS USED:  NONE  SPECIMEN:  No Specimen  DISPOSITION OF SPECIMEN:  N/A  COUNTS:  YES  TOURNIQUET:  * No tourniquets in log *  DICTATION: .Note written in EPIC  PLAN OF CARE: Admit to inpatient   PATIENT DISPOSITION:  PACU - hemodynamically stable.   Delay start of Pharmacological VTE agent (>24hrs) due to surgical blood loss or risk of bleeding: not applicable

## 2023-02-19 NOTE — Hospital Course (Signed)
Carla [name] is a 57 y.o. female with medical history significant for vascular dementia, history of CVA, dysarthria, COPD/asthma, TBI in 1999 who is admitted with a left intertrochanteric hip fracture.

## 2023-02-19 NOTE — Progress Notes (Signed)
Initial Nutrition Assessment  DOCUMENTATION CODES:   Underweight  INTERVENTION:  Once diet able to be advanced, recommend advancing to dysphagia 1 with nectar-thick liquids as this is patient's baseline diet per daughter's report.  Monitor magnesium, potassium, and phosphorus BID for at least 3 days, MD to replete as needed, as pt is at risk for refeeding syndrome.   Provide Ensure Enlive po BID, each supplement provides 350 kcal and 20 grams of protein. Please thicken to nectar-thick consistency.   Provide Magic cup BID with lunch and dinner, each supplement provides 290 kcal and 9 grams of protein.   Provide multivitamin with minerals po daily.   Recommend obtaining updated weight measurement in AM as daughter reports pt's current weight is lower than documented weight in chart.   NUTRITION DIAGNOSIS:   Increased nutrient needs related to post-op healing, hip fracture as evidenced by estimated needs.  GOAL:   Patient will meet greater than or equal to 90% of their needs  MONITOR:   Diet advancement, PO intake, Supplement acceptance, Labs, Weight trends, Skin, I & O's  REASON FOR ASSESSMENT:   Consult Assessment of nutrition requirement/status, Hip fracture protocol  ASSESSMENT:   57 year old female with PMHx of vascular dementia, hx CVA, dysarthria, COPD, asthma, TBI in 1999 who is admitted with left intertrochanteric femur fracture.  Pt in OR today for planned IM nail.  RD working remotely. Pt currently in OR. Per review of chart pt has limited verbal communication at baseline but does speak a few words. Pt lives at [facility]. Attempted to reach RN at facility to ask more about baseline diet, but unable to reach. Able to reach patient's daughter [name] over the phone at [phone]. She reports pt's diet is dysphagia 1 (pureed) with nectar-thick liquids at the facility. She reports pt has a fairly good appetite, but does not like to eat the pureed  foods very much. She reports pt has lost quite a bit of weight over the past month.    Daughter reports pt's UBW was 130 lbs (59.1 kg) and that pt has lost weight over time. She reports she feels pt has lost a lot of weight over the past month and that she is only around 60 lbs now. Per review of chart pt documented to be 50 kg on 12/28/21. Currently documented to be 40 kg. However, daughter reports pt weighs less than this, so unsure if this was a true measured weight. Recommend obtaining updated weight measurement in AM.  Medications reviewed and include: LR at 75 mL/hr  Labs reviewed  UOP: none documented (admitted < 24 hrs)  I/O: +2249 mL since admission  Suspect pt is at risk for malnutrition. Unable to confirm without completing NFPE as RD is working remotely at this time.  Discussed recommendation to advance to dysphagia 1 diet with nectar-thick liquids and risk for refeeding syndrome with MD and RN via secure chat.  NUTRITION - FOCUSED PHYSICAL EXAM:  Unable to complete as RD is working remotely.  Diet Order:   Diet Order             Diet NPO time specified Except for: Sips with Meds  Diet effective now                  EDUCATION NEEDS:   No education needs have been identified at this time  Skin:  Skin Assessment: Reviewed RN Assessment  Last BM:  02/18/23 per chart  Height:   Ht Readings  from Last 1 Encounters:  02/19/23 5' 2.01" (1.575 m)   Weight:   Wt Readings from Last 1 Encounters:  02/19/23 40 kg   BMI:  Body mass index is 16.12 kg/m.  Estimated Nutritional Needs:   Kcal:  1300-1500  Protein:  65-75 grams  Fluid:  1.3-1.5 L/day  Letta Median, MS, RD, LDN, CNSC Pager number available on Amion

## 2023-02-19 NOTE — Anesthesia Postprocedure Evaluation (Signed)
Anesthesia Post Note  Patient: Carla [name]  Procedure(s) Performed: INTRAMEDULLARY (IM) NAIL INTERTROCHANTERIC (Left)     Patient location during evaluation: PACU Anesthesia Type: General Level of consciousness: sedated and patient cooperative Pain management: pain level controlled Vital Signs Assessment: post-procedure vital signs reviewed and stable Respiratory status: spontaneous breathing Cardiovascular status: stable Anesthetic complications: no   No notable events documented.  Last Vitals:  Vitals:   02/19/23 1134 02/19/23 1636  BP: 136/75 (!) 115/90  Pulse: 83 (!) 102  Resp: 18 18  Temp: (!) 36.4 C   SpO2: 99% 100%    Last Pain:  Vitals:   02/19/23 1134  TempSrc: Oral  PainSc: 0-No pain                 Lewie Loron

## 2023-02-20 ENCOUNTER — Inpatient Hospital Stay (HOSPITAL_COMMUNITY): Payer: Medicaid Other

## 2023-02-20 DIAGNOSIS — J690 Pneumonitis due to inhalation of food and vomit: Secondary | ICD-10-CM

## 2023-02-20 DIAGNOSIS — S72142A Displaced intertrochanteric fracture of left femur, initial encounter for closed fracture: Secondary | ICD-10-CM | POA: Diagnosis not present

## 2023-02-20 LAB — CBC
HCT: 35.9 % — ABNORMAL LOW (ref 36.0–46.0)
HCT: 32.2 % — ABNORMAL LOW (ref 36.0–46.0)
Hemoglobin: 11.4 g/dL — ABNORMAL LOW (ref 12.0–15.0)
Hemoglobin: 10.4 g/dL — ABNORMAL LOW (ref 12.0–15.0)
MCH: 32.3 pg (ref 26.0–34.0)
MCH: 32 pg (ref 26.0–34.0)
MCHC: 31.8 g/dL (ref 30.0–36.0)
MCHC: 32.3 g/dL (ref 30.0–36.0)
MCV: 101.7 fL — ABNORMAL HIGH (ref 80.0–100.0)
MCV: 99.1 fL (ref 80.0–100.0)
Platelets: 302 10*3/uL (ref 150–400)
Platelets: 252 10*3/uL (ref 150–400)
RBC: 3.53 MIL/uL — ABNORMAL LOW (ref 3.87–5.11)
RBC: 3.25 MIL/uL — ABNORMAL LOW (ref 3.87–5.11)
RDW: 13.7 % (ref 11.5–15.5)
RDW: 13.6 % (ref 11.5–15.5)
WBC: 11.8 10*3/uL — ABNORMAL HIGH (ref 4.0–10.5)
WBC: 3.2 10*3/uL — ABNORMAL LOW (ref 4.0–10.5)
nRBC: 0 % (ref 0.0–0.2)
nRBC: 0 % (ref 0.0–0.2)

## 2023-02-20 LAB — BASIC METABOLIC PANEL
Anion gap: 18 — ABNORMAL HIGH (ref 5–15)
BUN: 11 mg/dL (ref 6–20)
CO2: 20 mmol/L — ABNORMAL LOW (ref 22–32)
Calcium: 9.9 mg/dL (ref 8.9–10.3)
Chloride: 99 mmol/L (ref 98–111)
Creatinine, Ser: 0.52 mg/dL (ref 0.44–1.00)
GFR, Estimated: >60 mL/min (ref >60)
Glucose, Bld: 73 mg/dL (ref 70–99)
Potassium: 4.9 mmol/L (ref 3.5–5.1)
Sodium: 137 mmol/L (ref 135–145)

## 2023-02-20 LAB — MAGNESIUM: Magnesium: 1.9 mg/dL (ref 1.7–2.4)

## 2023-02-20 LAB — PHOSPHORUS: Phosphorus: 3 mg/dL (ref 2.5–4.6)

## 2023-02-20 LAB — PROCALCITONIN: Procalcitonin: 0.63 ng/mL

## 2023-02-20 MED ORDER — SODIUM CHLORIDE 0.9 % IV SOLN
1.5000 g | Freq: Four times a day (QID) | INTRAVENOUS | Status: DC
  Administered 2023-02-20 – 2023-02-22 (×8): 1.5 g via INTRAVENOUS
  Filled 2023-02-20 (×12): qty 4

## 2023-02-20 MED ORDER — SODIUM CHLORIDE 0.9 % IV BOLUS
500.0000 mL | Freq: Once | INTRAVENOUS | Status: AC
  Administered 2023-02-20: 500 mL via INTRAVENOUS

## 2023-02-20 NOTE — Progress Notes (Signed)
Called into room by patient's daughter for patient "breathing hard." Entered room to find patient sitting in chair in visible distress. Patient has vascular dementia, but when asked if she was having SOB, patient responded yes. Audible rhonchi heard and noted with auscultation. Daughter reported patient was eating a magic cup when she became SOB. Initial O2 reading on room air was 64% with labored breathing. Deep oral suction performed and sats slightly improved into the 70s. NRB placed, RT and rapid response called, Dr. Sharl Ma paged. Patient was able to recover to 100% on NRB and placed on high flow nasal cannula starting at 10L/min. Currently on 8L/min. CXR obtained and IV antibiotics to be started. Patient was transferred from chair to bed with x3 assist. Daughter remains at bedside. Call bell within reach.

## 2023-02-20 NOTE — Progress Notes (Addendum)
Called by RN that patient aspirated while she was eating Magic cup ice cream.  Rapid response team was called, patient O2 sats dropped to 70s, became hypotensive.   Initially required 15 L/min 100% nonrebreather, O2 sats improved to 100%.  Patient is alert and following commands. Start portable chest x-ray obtained Will give 500 mL saline bolus Chest x-ray shows hazy infiltrate in right lower lobe  Will start empiric Unasyn for ?  aspiration pneumonia Continue oxygen via HFNC, currently requiring 10 L/min  Patient is DNR/DNI  Critical care time spent 40 minutes

## 2023-02-20 NOTE — Progress Notes (Signed)
MEWS Progress Note  Patient Details Name: Carla [name] MRN: [id number] DOB: 12-18-1965 Today's Date: 02/20/2023   MEWS Flowsheet Documentation:  Assess: MEWS Score Temp: 98.1 F (36.7 C) BP: 99/71 MAP (mmHg): 81 Pulse Rate: (!) 107 ECG Heart Rate: (!) 106 Resp: (!) 27 Level of Consciousness: Alert SpO2: 100 % O2 Device: High Flow Nasal Cannula O2 Flow Rate (L/min): 10 L/min Assess: MEWS Score MEWS Temp: 0 MEWS Systolic: 1 MEWS Pulse: 1 MEWS RR: 2 MEWS LOC: 0 MEWS Score: 4 MEWS Score Color: Red Assess: SIRS CRITERIA SIRS Temperature : 0 SIRS Respirations : 1 SIRS Pulse: 1 SIRS WBC: 0 SIRS Score Sum : 2 SIRS Temperature : 0 SIRS Pulse: 1 SIRS Respirations : 1 SIRS WBC: 0 SIRS Score Sum : 2 Assess: if the MEWS score is Yellow or Red Were vital signs accurate and taken at a resting state?: Yes Does the patient meet 2 or more of the SIRS criteria?: Yes Does the patient have a confirmed or suspected source of infection?: No MEWS guidelines implemented : Yes, red Treat MEWS Interventions: Considered administering scheduled or prn medications/treatments as ordered Take Vital Signs Increase Vital Sign Frequency : Red: Q1hr x2, continue Q4hrs until patient remains green for 12hrs Escalate MEWS: Escalate: Red: Discuss with charge nurse and notify provider. Consider notifying RRT. If remains red for 2 hours consider need for higher level of care        Catalina Lunger 02/20/2023, 5:24 PM

## 2023-02-20 NOTE — Progress Notes (Signed)
Responded to call from 5N27 about patient desaturation. Upon entering room, patient on 15L NRB. SPO2 at 84%. Per RN patient aspirated on easy cup ice cream. Rapid Response at bedside. Spo2 currently at 95%-97%. Patient stable at this time.

## 2023-02-20 NOTE — Significant Event (Signed)
Rapid Response Event Note   Reason for Call :  O2 sats in the 60s after swallowing thickened ice cream.   Staff has placed patient on NRB  Initial Focused Assessment:  Patient is alert and will speak, she is aphasic and has dementia at baseline. Lung sounds with scattered rhonchi Heart tones regular She is breathing rapidly, mildly labored breathing  BP 90/71  HR 108  RR 36  O2 sat 94% on NRB Temp 98.1   Interventions:  Deep oral suction PCXR Weaned O2 to 10L HFNC 500cc NS bolus over 2 hours  Plan of Care:  RN to call if unable to wean O2, if patient's breathing becomes more labored or if she has a fever or remains hypotensive after fluid bolus   Event Summary:   MD Notified: Lama Call Time: 1553 Arrival Time: 1557 End Time: 1545  Marcellina Millin, RN

## 2023-02-20 NOTE — Progress Notes (Signed)
Subjective: 1 Day Post-Op Procedure(s) (LRB): INTRAMEDULLARY (IM) NAIL INTERTROCHANTERIC (Left) Patient reports pain as mild.    Objective: Vital signs in last 24 hours: Temp:  [97.3 F (36.3 C)-97.7 F (36.5 C)] 97.5 F (36.4 C) (11/24 0534) Pulse Rate:  [72-102] 76 (11/24 0534) Resp:  [16-20] 18 (11/24 0534) BP: (115-214)/(75-109) 176/99 (11/24 0534) SpO2:  [94 %-100 %] 94 % (11/24 0817)  Intake/Output from previous day: 11/23 0701 - 11/24 0700 In: 720 [P.O.:360; I.V.:200; IV Piggyback:160] Out: 520 [Urine:500; Blood:20] Intake/Output this shift: No intake/output data recorded.  Recent Labs    02/18/23 1525 02/19/23 0537  HGB 12.1 11.2*   Recent Labs    02/18/23 1525 02/19/23 0537  WBC 9.7 14.5*  RBC 3.69* 3.55*  HCT 36.7 35.4*  PLT 259 253   Recent Labs    02/18/23 1525 02/19/23 0537  NA 138 137  K 3.9 4.0  CL 97* 98  CO2 31 29  BUN 11 10  CREATININE 0.49 0.59  GLUCOSE 138* 144*  CALCIUM 9.9 9.8   No results for input(s): "LABPT", "INR" in the last 72 hours.  Neurologically intact ABD soft Neurovascular intact Sensation intact distally Intact pulses distally Dorsiflexion/Plantar flexion intact Incision: dressing C/D/I and no drainage No cellulitis present Compartment soft No calf pain or sign of DVT   Assessment/Plan: 1 Day Post-Op Procedure(s) (LRB): INTRAMEDULLARY (IM) NAIL INTERTROCHANTERIC (Left) Advance diet Up with therapy D/C IV fluids WBAT LLE ASA for DVT ppx   Carla [name] 02/20/2023, 8:23 AM

## 2023-02-20 NOTE — Evaluation (Signed)
Occupational Therapy Evaluation Patient Details Name: Carla [name] MRN: [id number] DOB: October 12, 1965 Today's Date: 02/20/2023   History of Present Illness 57 y.o female admitted 02/18/23 after unwitnessed fall at facility 5 days prior to admission. Orthopedic follow up revealed L intertrochanteric hip fx. Underwen IM naip on 02/19/23. PMH: vascular dementia, CVA, dysarthria, COPD, asthma, TBI 1999.   Clinical Impression   Pt is a resident of SNF. She reports having 2 person assist to transfer to her w/c, but she can propel her w/c throughout the facility independently. She can self feed and groom with set up. She is assisted for bathing, dressing and toileting. Pt eager to get OOB with therapies. Min assist for bed mobility. Pt completed grooming at EOB with supervision and drank thickened juice with set up. Pt performed stand pivot transfer, bed to chair with +2 min assist toward her R side. Pt minimally verbal throughout session, communicates with phrases and gestures. Remained up in chair with alarm activated and needs met. Recommend return to SNF upon discharge.       If plan is discharge home, recommend the following: A lot of help with walking and/or transfers;A lot of help with bathing/dressing/bathroom;Assistance with cooking/housework;Assistance with feeding;Direct supervision/assist for medications management;Direct supervision/assist for financial management;Assist for transportation;Help with stairs or ramp for entrance    Functional Status Assessment  Patient has had a recent decline in their functional status and demonstrates the ability to make significant improvements in function in a reasonable and predictable amount of time.  Equipment Recommendations  None recommended by OT    Recommendations for Other Services       Precautions / Restrictions Precautions Precautions: Fall Restrictions Weight Bearing Restrictions: Yes LLE Weight Bearing: Weight bearing as tolerated       Mobility Bed Mobility Overal bed mobility: Needs Assistance Bed Mobility: Supine to Sit     Supine to sit: HOB elevated, Min assist     General bed mobility comments: assist for L LE, increased time    Transfers Overall transfer level: Needs assistance Equipment used: 2 person hand held assist Transfers: Sit to/from Stand, Bed to chair/wheelchair/BSC Sit to Stand: +2 physical assistance, Min assist Stand pivot transfers: Min assist, +2 physical assistance         General transfer comment: assist to rise and steady as pt pivoted from bed to chair toward R      Balance Overall balance assessment: Needs assistance   Sitting balance-Leahy Scale: Fair       Standing balance-Leahy Scale: Poor                             ADL either performed or assessed with clinical judgement   ADL Overall ADL's : Needs assistance/impaired Eating/Feeding: Set up;Sitting Eating/Feeding Details (indicate cue type and reason): thickened juice Grooming: Wash/dry hands;Sitting;Supervision/safety;Wash/dry face               Lower Body Dressing: Total assistance;Bed level                 General ADL Comments: pt is assisted for bathing,dressing and toileting at baseline     Vision Ability to See in Adequate Light: 0 Adequate Patient Visual Report: No change from baseline       Perception         Praxis         Pertinent Vitals/Pain Pain Assessment Pain Assessment: Faces Faces Pain Scale: No hurt  Extremity/Trunk Assessment Upper Extremity Assessment Upper Extremity Assessment: Generalized weakness   Lower Extremity Assessment Lower Extremity Assessment: Defer to PT evaluation   Cervical / Trunk Assessment Cervical / Trunk Assessment: Normal   Communication Communication Communication: Difficulty communicating thoughts/reduced clarity of speech;Other (comment) (dysarthric, minimally verbal)   Cognition Arousal: Alert Behavior During  Therapy: Flat affect Overall Cognitive Status: History of cognitive impairments - at baseline                                       General Comments       Exercises     Shoulder Instructions      Home Living Family/patient expects to be discharged to:: Skilled nursing facility                                 Additional Comments: pt is a resident of [facility]      Prior Functioning/Environment Prior Level of Function : Needs assist             Mobility Comments: pt reports she has 2 person assist to transfer, can propel her w/c independently ADLs Comments: self feeds, grooms, assisted for bathing, dressing and toileting        OT Problem List: Decreased strength;Decreased activity tolerance;Impaired balance (sitting and/or standing);Decreased cognition      OT Treatment/Interventions: Self-care/ADL training;DME and/or AE instruction;Therapeutic activities;Patient/family education;Balance training    OT Goals(Current goals can be found in the care plan section) Acute Rehab OT Goals OT Goal Formulation: With patient Time For Goal Achievement: 03/06/23 Potential to Achieve Goals: Good ADL Goals Pt Will Transfer to Toilet: with mod assist;bedside commode;stand pivot transfer Additional ADL Goal #1: Pt will complete bed mobility with supervision in preparation for OOB.  OT Frequency: Min 1X/week    Co-evaluation PT/OT/SLP Co-Evaluation/Treatment: Yes Reason for Co-Treatment: To address functional/ADL transfers;For patient/therapist safety PT goals addressed during session: Mobility/safety with mobility;Balance OT goals addressed during session: ADL's and self-care      AM-PAC OT "6 Clicks" Daily Activity     Outcome Measure Help from another person eating meals?: A Little Help from another person taking care of personal grooming?: A Little Help from another person toileting, which includes using toliet, bedpan, or urinal?:  Total Help from another person bathing (including washing, rinsing, drying)?: Total Help from another person to put on and taking off regular upper body clothing?: A Lot Help from another person to put on and taking off regular lower body clothing?: Total 6 Click Score: 11   End of Session Equipment Utilized During Treatment: Gait belt Nurse Communication: Mobility status;Other (comment) (ok to leave off mitts)  Activity Tolerance: Patient tolerated treatment well Patient left: in chair;with call bell/phone within reach;with chair alarm set;Other (comment) (posey waist alarm)  OT Visit Diagnosis: Unsteadiness on feet (R26.81);Other abnormalities of gait and mobility (R26.89);Muscle weakness (generalized) (M62.81);History of falling (Z91.81);Other symptoms and signs involving cognitive function                Time: 1202-1227 OT Time Calculation (min): 25 min Charges:  OT General Charges $OT Visit: 1 Visit OT Evaluation $OT Eval Moderate Complexity: 1 Mod Berna Spare, OTR/L Acute Rehabilitation Services Office: [phone]  Evern Bio 02/20/2023, 2:45 PM

## 2023-02-20 NOTE — Addendum Note (Signed)
Addendum  created 02/20/23 0814 by Colbert Coyer, CRNA   Flowsheet accepted

## 2023-02-20 NOTE — Progress Notes (Signed)
[facility]  PROGRESS NOTE  Carla [name] FAO:[id number] DOB: May 16, 1965 DOA: 02/18/2023 PCP: Patient, No Pcp Per   Brief HPI:   57 y.o. female with medical history significant for vascular dementia, history of CVA, dysarthria, COPD/asthma, TBI in 1999 who is admitted with a left intertrochanteric femur fracture.  History is limited from patient due to dementia and is otherwise supplemented by EDP, chart review, and daughter by phone.  Patient currently resides at [facility] facility.  She reportedly was found on the floor 5 days ago after an apparent unwitnessed fall.  She had been treated for pain with tramadol and Tylenol.  She is confined to bed/wheelchair at baseline.     She was seen in the orthopedic office earlier today for further evaluation.  X-ray was obtained and showed an intertrochanteric fracture of the left hip.  She was subsequently sent to the [facility] ED for further evaluation.    Assessment/Plan:   Left intertrochanteric hip fracture -Occurred after unwitnessed fall at facility -Orthopedics consulted, underwent IM nail today -DVT prophylaxis with aspirin as per orthopedics -PT/OT  COPD/asthma -Stable, no wheezing -Continue Dulera, albuterol as needed  History of CVA -Not on statin or aspirin at home  Vascular dementia -Continue donepezil  Hypertension -Blood pressure has been elevated -Started  hydralazine as needed    Medications     aspirin EC  325 mg Oral Q breakfast   docusate sodium  100 mg Oral BID   donepezil  10 mg Oral Daily   feeding supplement  237 mL Oral BID BM   mometasone-formoterol  2 puff Inhalation BID   multivitamin with minerals  1 tablet Oral Daily   sertraline  75 mg Oral Daily     Data Reviewed:   CBG:  Recent Labs  Lab 02/19/23 1032  GLUCAP 145*    SpO2: 100 % O2 Flow Rate (L/min): 6 L/min    Vitals:   02/20/23 0534 02/20/23 0817 02/20/23 0909 02/20/23 0938  BP: (!) 176/99  (!) 150/75    Pulse: 76  78   Resp: 18  17   Temp: (!) 97.5 F (36.4 C)  97.7 F (36.5 C)   TempSrc: Oral  Oral   SpO2: 97% 94% 100%   Weight:    38.6 kg  Height:          Data Reviewed:  Basic Metabolic Panel: Recent Labs  Lab 02/18/23 1525 02/19/23 0537 02/20/23 0616  NA 138 137 137  K 3.9 4.0 4.9  CL 97* 98 99  CO2 31 29 20*  GLUCOSE 138* 144* 73  BUN 11 10 11   CREATININE 0.49 0.59 0.52  CALCIUM 9.9 9.8 9.9  MG  --   --  1.9  PHOS  --   --  3.0    CBC: Recent Labs  Lab 02/18/23 1525 02/19/23 0537 02/20/23 0616  WBC 9.7 14.5* 11.8*  NEUTROABS 7.5  --   --   HGB 12.1 11.2* 11.4*  HCT 36.7 35.4* 35.9*  MCV 99.5 99.7 101.7*  PLT 259 253 302    LFT Recent Labs  Lab 02/18/23 1525  AST 19  ALT 12  ALKPHOS 64  BILITOT 0.7  PROT 8.1  ALBUMIN 3.9     Antibiotics: Anti-infectives (From admission, onward)    Start     Dose/Rate Route Frequency Ordered Stop   02/19/23 1500  ceFAZolin (ANCEF) IVPB 2g/100 mL premix        2 g 200 mL/hr over 30 Minutes Intravenous  Every 6 hours 02/19/23 1109 02/20/23 0634   02/19/23 0807  ceFAZolin (ANCEF) 2-4 GM/100ML-% IVPB       Note to Pharmacy: [name] D: cabinet override      02/19/23 0807 02/19/23 2014   02/19/23 0600  ceFAZolin (ANCEF) IVPB 2g/100 mL premix        2 g 200 mL/hr over 30 Minutes Intravenous On call to O.R. 02/18/23 2334 02/19/23 0926        DVT prophylaxis: Per orthopedics  Code Status: DNR  Family Communication: Discussed with family number at bedside   CONSULTS    Subjective   Denies pain   Objective    Physical Examination:  General-appears in no acute distress Heart-S1-S2, regular, no murmur auscultated Lungs-clear to auscultation bilaterally, no wheezing or crackles auscultated Abdomen-soft, nontender, no organomegaly Extremities-no edema in the lower extremities Neuro-alert, following commands  Status is: Inpatient:             Meredeth Ide   [facility] If 7PM-7AM, please contact night-coverage at [email or web address], Office  [phone]   02/20/2023, 11:52 AM  LOS: 2 days

## 2023-02-20 NOTE — Evaluation (Signed)
Physical Therapy Evaluation Patient Details Name: Carla [name] MRN: [id number] DOB: 07-10-1965 Today's Date: 02/20/2023  History of Present Illness  57 y.o. female admitted 02/18/23 after and unwitnessed fall at facility 5 days prior to admission.  Orthopedic follow up revealed L intertrochanteric hip fx, underwent IM nail on 02/19/23.  PMH: vascular dementia, CVA, dysarthria, COPD, asthma, DM2, HTN.  Clinical Impression  Patient presents with decreased mobility due to pain, decreased strength L LE and history of falling.  She was mobilizing at facility with A to wheelchair though propels in chair on her own.  States fell from bed.  Today able to move to EOB with min A and +2 mod A for bed to chair.  Feel she will benefit from continued skilled PT in the acute setting and follow up PT her facility at d/c.        If plan is discharge home, recommend the following: A little help with walking and/or transfers;A little help with bathing/dressing/bathroom;Assist for transportation;Supervision due to cognitive status   Can travel by private vehicle   Yes    Equipment Recommendations None recommended by PT  Recommendations for Other Services       Functional Status Assessment Patient has had a recent decline in their functional status and demonstrates the ability to make significant improvements in function in a reasonable and predictable amount of time.     Precautions / Restrictions Precautions Precautions: Fall Restrictions Weight Bearing Restrictions: Yes LLE Weight Bearing: Weight bearing as tolerated      Mobility  Bed Mobility Overal bed mobility: Needs Assistance Bed Mobility: Supine to Sit     Supine to sit: HOB elevated, Min assist     General bed mobility comments: assist for L LE, increased time    Transfers Overall transfer level: Needs assistance Equipment used: 2 person hand held assist Transfers: Sit to/from Stand, Bed to chair/wheelchair/BSC Sit to  Stand: +2 physical assistance, Min assist Stand pivot transfers: Min assist, +2 physical assistance         General transfer comment: assist to rise and steady as pt pivoted from bed to chair toward R    Ambulation/Gait                  Stairs            Wheelchair Mobility     Tilt Bed    Modified Rankin (Stroke Patients Only)       Balance Overall balance assessment: Needs assistance   Sitting balance-Leahy Scale: Fair       Standing balance-Leahy Scale: Poor Standing balance comment: UE support in standing                             Pertinent Vitals/Pain Pain Assessment Faces Pain Scale: No hurt    Home Living Family/patient expects to be discharged to:: Skilled nursing facility                   Additional Comments: pt is a resident of [facility]    Prior Function Prior Level of Function : Needs assist             Mobility Comments: pt reports she has 2 person assist to transfer, can propel her w/c independently ADLs Comments: self feeds, grooms, assisted for bathing, dressing and toileting     Extremity/Trunk Assessment   Upper Extremity Assessment Upper Extremity Assessment: Defer to OT evaluation    Lower  Extremity Assessment Lower Extremity Assessment: LLE deficits/detail LLE Deficits / Details: mobilizing in bed though with weakness evident L LE, able to tolerate feet up in sitting and noted active ankle DF/PF    Cervical / Trunk Assessment Cervical / Trunk Assessment: Normal  Communication   Communication Communication: Difficulty communicating thoughts/reduced clarity of speech;Other (comment)  Cognition Arousal: Alert Behavior During Therapy: Flat affect Overall Cognitive Status: History of cognitive impairments - at baseline                                          General Comments General comments (skin integrity, edema, etc.): cachexia, pt drinking slowly with nectar thick  juice    Exercises General Exercises - Lower Extremity Ankle Circles/Pumps: AROM, Both, 5 reps, Supine   Assessment/Plan    PT Assessment Patient needs continued PT services  PT Problem List Decreased mobility;Decreased knowledge of precautions;Decreased safety awareness;Decreased activity tolerance;Decreased strength       PT Treatment Interventions Functional mobility training;Therapeutic activities;Therapeutic exercise;Balance training;Wheelchair mobility training;Patient/family education;DME instruction    PT Goals (Current goals can be found in the Care Plan section)  Acute Rehab PT Goals Patient Stated Goal: none stated PT Goal Formulation: Patient unable to participate in goal setting Time For Goal Achievement: 03/06/23 Potential to Achieve Goals: Fair    Frequency Min 1X/week     Co-evaluation PT/OT/SLP Co-Evaluation/Treatment: Yes Reason for Co-Treatment: To address functional/ADL transfers;For patient/therapist safety PT goals addressed during session: Mobility/safety with mobility;Balance OT goals addressed during session: ADL's and self-care       AM-PAC PT "6 Clicks" Mobility  Outcome Measure Help needed turning from your back to your side while in a flat bed without using bedrails?: A Lot Help needed moving from lying on your back to sitting on the side of a flat bed without using bedrails?: A Lot Help needed moving to and from a bed to a chair (including a wheelchair)?: A Lot Help needed standing up from a chair using your arms (e.g., wheelchair or bedside chair)?: Total Help needed to walk in hospital room?: Total Help needed climbing 3-5 steps with a railing? : Total 6 Click Score: 9    End of Session Equipment Utilized During Treatment: Gait belt Activity Tolerance: Patient tolerated treatment well Patient left: in chair;with call bell/phone within reach;with chair alarm set (seat belt alarm) Nurse Communication: Mobility status PT Visit Diagnosis:  Other abnormalities of gait and mobility (R26.89);History of falling (Z91.81)    Time: 1202-1227 PT Time Calculation (min) (ACUTE ONLY): 25 min   Charges:   PT Evaluation $PT Eval Moderate Complexity: 1 Mod   PT General Charges $$ ACUTE PT VISIT: 1 Visit         Sheran Lawless, PT Acute Rehabilitation Services Office:[phone] 02/20/2023   Elray Mcgregor 02/20/2023, 4:19 PM

## 2023-02-20 NOTE — Progress Notes (Signed)
Pharmacy Antibiotic Note  Carla [name] is a 57 y.o. female admitted on 02/18/2023 after being found down and found to have a femur fracture. Now with concerns for aspiration pneumonia.  Pharmacy has been consulted for Unasyn dosing.  Plan: Unasyn 1.5g IV Q6h Trend WBC, fever, renal function F/u cultures, clinical progress, levels as indicated De-escalate when able   Height: 5' 2.01" (157.5 cm) Weight: 38.6 kg (85 lb 1.6 oz) IBW/kg (Calculated) : 50.12  Temp (24hrs), Avg:97.8 F (36.6 C), Min:97.5 F (36.4 C), Max:98.2 F (36.8 C)  Recent Labs  Lab 02/18/23 1525 02/19/23 0537 02/20/23 0616  WBC 9.7 14.5* 11.8*  CREATININE 0.49 0.59 0.52    Estimated Creatinine Clearance: 47.8 mL/min (by C-G formula based on SCr of 0.52 mg/dL).    No Known Allergies  Thank you for allowing pharmacy to be a part of this patient's care.  Carrington Clamp 02/20/2023 5:14 PM

## 2023-02-21 DIAGNOSIS — S72142A Displaced intertrochanteric fracture of left femur, initial encounter for closed fracture: Secondary | ICD-10-CM | POA: Diagnosis not present

## 2023-02-21 DIAGNOSIS — J449 Chronic obstructive pulmonary disease, unspecified: Secondary | ICD-10-CM | POA: Diagnosis not present

## 2023-02-21 LAB — MAGNESIUM: Magnesium: 1.6 mg/dL — ABNORMAL LOW (ref 1.7–2.4)

## 2023-02-21 LAB — PHOSPHORUS: Phosphorus: 2.4 mg/dL — ABNORMAL LOW (ref 2.5–4.6)

## 2023-02-21 MED ORDER — TRAMADOL HCL 50 MG PO TABS: 50.0000 mg | ORAL_TABLET | Freq: Three times a day (TID) | ORAL | 0 refills | Status: AC | PRN

## 2023-02-21 MED ORDER — K PHOS MONO-SOD PHOS DI & MONO 155-852-130 MG PO TABS
250.0000 mg | ORAL_TABLET | Freq: Two times a day (BID) | ORAL | Status: AC
  Administered 2023-02-21 – 2023-02-22 (×2): 250 mg via ORAL
  Filled 2023-02-21 (×2): qty 1

## 2023-02-21 MED ORDER — MAGNESIUM SULFATE 2 GM/50ML IV SOLN
2.0000 g | Freq: Once | INTRAVENOUS | Status: AC
  Administered 2023-02-21: 2 g via INTRAVENOUS
  Filled 2023-02-21: qty 50

## 2023-02-21 MED ORDER — ASPIRIN 81 MG PO TBEC: 81.0000 mg | DELAYED_RELEASE_TABLET | Freq: Two times a day (BID) | ORAL | 0 refills | Status: AC

## 2023-02-21 MED ORDER — DOCUSATE SODIUM 50 MG/5ML PO LIQD
100.0000 mg | Freq: Every day | ORAL | Status: DC
  Administered 2023-02-21 – 2023-02-22 (×2): 100 mg via ORAL
  Filled 2023-02-21 (×2): qty 10

## 2023-02-21 MED ORDER — GUAIFENESIN 100 MG/5ML PO LIQD
15.0000 mL | Freq: Three times a day (TID) | ORAL | Status: DC
  Administered 2023-02-21 – 2023-02-22 (×4): 15 mL via ORAL
  Filled 2023-02-21 (×5): qty 15

## 2023-02-21 NOTE — TOC Initial Note (Signed)
Transition of Care [facility]) - Initial/Assessment Note    Patient Details  Name: Carla [name] MRN: [id number] Date of Birth: 1965/10/07  Transition of [facility]) CM/SW Contact:    Lorri Frederick, LCSW Phone Number: 02/21/2023, 9:56 AM  Clinical Narrative:   Pt oriented x1, CSW spoke with daughter [name] in room.  All information from [place].  Pt has been LTC at [facility] but daughter would like other SNF option due to falls.  Discussed that CSW can attempt this but pt cannot remain in hospital once stable for DC just to change SNF, daughter verbalizes understanding.  Daughter agreeable to STR to LTC, permission given to send out referral in hub.  Referral sent out in hub.                  Expected Discharge Plan: Skilled Nursing Facility Barriers to Discharge: Continued Medical Work up, SNF Pending bed offer   Patient Goals and CMS Choice     Choice offered to / list presented to : Adult Children (daughter [name])      Expected Discharge Plan and Services In-house Referral: Clinical Social Work   Post Acute Care Choice: Skilled Nursing Facility Living arrangements for the past 2 months: Skilled Nursing Facility Psychologist, counselling LTC)                                      Prior Living Arrangements/Services Living arrangements for the past 2 months: Skilled Nursing Facility Psychologist, counselling LTC) Lives with:: Facility Resident Patient language and need for interpreter reviewed:: Yes        Need for Family Participation in Patient Care: Yes (Comment) Care giver support system in place?: Yes (comment) Current home services: Other (comment) (na) Criminal Activity/Legal Involvement Pertinent to Current Situation/Hospitalization: No - Comment as needed  Activities of Daily Living      Permission Sought/Granted                  Emotional Assessment Appearance:: Appears stated age Attitude/Demeanor/Rapport: Unable to Assess Affect (typically observed): Unable to  Assess Orientation: : Oriented to Self      Admission diagnosis:  Hip fracture (HCC) [S72.009A] Closed fracture of left hip, initial encounter [facility]) [S72.002A] Patient Active Problem List   Diagnosis Date Noted   Closed intertrochanteric fracture of left hip, initial encounter (HCC) 02/18/2023   Vascular dementia (HCC) 02/18/2023   Underweight 07/30/2022   COPD (chronic obstructive pulmonary disease) (HCC) 07/30/2022   Dysphagia 05/04/2022   Palliative care encounter 05/04/2022   Dementia (HCC) 01/10/2021   PCP:  Patient, No Pcp Per Pharmacy:   [facility] [address] [place] [address] Phone: [phone] Fax: [phone]     Social Determinants of Health (SDOH) Social History: SDOH Screenings   Food Insecurity: Patient Unable To Answer (02/19/2023)  Housing: High Risk (02/19/2023)  Transportation Needs: Patient Unable To Answer (02/19/2023)  Utilities: Patient Unable To Answer (02/19/2023)  Tobacco Use: High Risk (02/19/2023)   SDOH Interventions:     Readmission Risk Interventions     No data to display

## 2023-02-21 NOTE — Progress Notes (Signed)
   Subjective:  Carla [name] is a 57 y.o. female, 2 Days Post-Op   s/p Procedure(s): INTRAMEDULLARY (IM) NAIL INTERTROCHANTERIC   History limited by baseline dementia. Reports no significant pain.   Objective:   VITALS:   Vitals:   02/20/23 2235 02/21/23 0500 02/21/23 0729 02/21/23 1505  BP: 112/81  124/77 91/64  Pulse:  93 78 96  Resp:  (!) 22 20 19   Temp: 99.7 F (37.6 C)  98.3 F (36.8 C) 98.9 F (37.2 C)  TempSrc: Oral  Oral Oral  SpO2:  100% 100% 94%  Weight:      Height:       Hospital bed NAD   LLE:  Neurovascular intact Sensation intact distally Intact pulses distally Dorsiflexion/Plantar flexion intact Incision: dressing C/D/I Compartment soft Two new mepilex dressings placed. Incisions with staples, no signs of infection. , calves soft.   Lab Results  Component Value Date   WBC 3.2 (L) 02/20/2023   HGB 10.4 (L) 02/20/2023   HCT 32.2 (L) 02/20/2023   MCV 99.1 02/20/2023   PLT 252 02/20/2023   BMET    Component Value Date/Time   NA 137 02/20/2023 0616   K 4.9 02/20/2023 0616   CL 99 02/20/2023 0616   CO2 20 (L) 02/20/2023 0616   GLUCOSE 73 02/20/2023 0616   BUN 11 02/20/2023 0616   CREATININE 0.52 02/20/2023 0616   CALCIUM 9.9 02/20/2023 0616   GFRNONAA >60 02/20/2023 0616     Assessment/Plan: 2 Days Post-Op   Principal Problem:   Closed intertrochanteric fracture of left hip, initial encounter (HCC) Active Problems:   COPD (chronic obstructive pulmonary disease) (HCC)   Vascular dementia (HCC)   Advance diet Up with therapy  Dressings: Change as needed, can have bath after 1 weeks. Keep incisions covered with dry dressing/ mepilex.   Weightbearing Status: WBAT with walker DVT Prophylaxis: Aspirin, oupatient 81mg  BID  Follow up 2 weeks from [facility] at [facility] for xrays and staple removal.  Ready for d/c from ortho perspective, ortho will sign off.   Carla [name] 02/21/2023, 5:06 PM  Dion Saucier PA-C  Physician  Assistant with Dr. Rebekah [place] [facility]

## 2023-02-21 NOTE — Progress Notes (Signed)
Transition of Care [facility]) - CAGE-AID Screening   Patient Details  Name: Carla [name] MRN: [id number] Date of Birth: 1966-01-07  Hewitt Shorts, RN Trauma Response Nurse Phone Number: [phone] 02/21/2023, 9:14 AM   CAGE-AID Screening:    Have You Ever Felt You Ought to Cut Down on Your Drinking or Drug Use?: No Have People Annoyed You By Critizing Your Drinking Or Drug Use?: No Have You Felt Bad Or Guilty About Your Drinking Or Drug Use?: No Have You Ever Had a Drink or Used Drugs First Thing In The Morning to Steady Your Nerves or to Get Rid of a Hangover?: No CAGE-AID Score: 0  Substance Abuse Education Offered: No (No services needed)

## 2023-02-21 NOTE — Plan of Care (Signed)

## 2023-02-21 NOTE — Progress Notes (Signed)
[facility]  PROGRESS NOTE  Carla [name] ZOX:[id number] DOB: 1965-09-09 DOA: 02/18/2023 PCP: Patient, No Pcp Per   Brief HPI:   57 y.o. female with medical history significant for vascular dementia, history of CVA, dysarthria, COPD/asthma, TBI in 1999 who is admitted with a left intertrochanteric femur fracture.  History is limited from patient due to dementia and is otherwise supplemented by EDP, chart review, and daughter by phone.  Patient currently resides at [facility] facility.  She reportedly was found on the floor 5 days ago after an apparent unwitnessed fall.  She had been treated for pain with tramadol and Tylenol.  She is confined to bed/wheelchair at baseline.     She was seen in the orthopedic office earlier today for further evaluation.  X-ray was obtained and showed an intertrochanteric fracture of the left hip.  She was subsequently sent to the [facility] ED for further evaluation.    Assessment/Plan:   Left intertrochanteric hip fracture -Occurred after unwitnessed fall at facility -Orthopedics consulted, underwent IM nail today -DVT prophylaxis with aspirin as per orthopedics -PT/OT  Acute hypoxemic respiratory failure -Secondary to aspiration pneumonia -Procalcitonin 0.63; chest x-ray shows right lower lobe infiltrate -Oxygen has been weaned off to room air -Continue IV Unasyn  Dysphagia -Will obtain swallow evaluation  COPD/asthma -Stable, no wheezing -Continue Dulera, albuterol as needed  History of CVA -Not on statin or aspirin at home  Vascular dementia -Continue donepezil  Hypertension -Blood pressure has been elevated -Started  hydralazine as needed  Hypomagnesemia -Replace magnesium  Hypophosphatemia -Replace phosphorus    Medications     aspirin EC  325 mg Oral Q breakfast   docusate sodium  100 mg Oral BID   donepezil  10 mg Oral Daily   feeding supplement  237 mL Oral BID BM   mometasone-formoterol  2 puff  Inhalation BID   multivitamin with minerals  1 tablet Oral Daily   sertraline  75 mg Oral Daily     Data Reviewed:   CBG:  Recent Labs  Lab 02/19/23 1032  GLUCAP 145*    SpO2: 100 % O2 Flow Rate (L/min): 6 L/min    Vitals:   02/20/23 1749 02/20/23 2235 02/21/23 0500 02/21/23 0729  BP: 105/70 112/81  124/77  Pulse: (!) 102 (!) 109 93 78  Resp: 20 18 (!) 22 20  Temp:  99.7 F (37.6 C)  98.3 F (36.8 C)  TempSrc:  Oral  Oral  SpO2: 100% 99% 100% 100%  Weight:      Height:          Data Reviewed:  Basic Metabolic Panel: Recent Labs  Lab 02/18/23 1525 02/19/23 0537 02/20/23 0616 02/21/23 0648  NA 138 137 137  --   K 3.9 4.0 4.9  --   CL 97* 98 99  --   CO2 31 29 20*  --   GLUCOSE 138* 144* 73  --   BUN 11 10 11   --   CREATININE 0.49 0.59 0.52  --   CALCIUM 9.9 9.8 9.9  --   MG  --   --  1.9 1.6*  PHOS  --   --  3.0 2.4*    CBC: Recent Labs  Lab 02/18/23 1525 02/19/23 0537 02/20/23 0616 02/20/23 2207  WBC 9.7 14.5* 11.8* 3.2*  NEUTROABS 7.5  --   --   --   HGB 12.1 11.2* 11.4* 10.4*  HCT 36.7 35.4* 35.9* 32.2*  MCV 99.5 99.7 101.7* 99.1  PLT  259 253 302 252    LFT Recent Labs  Lab 02/18/23 1525  AST 19  ALT 12  ALKPHOS 64  BILITOT 0.7  PROT 8.1  ALBUMIN 3.9     Antibiotics: Anti-infectives (From admission, onward)    Start     Dose/Rate Route Frequency Ordered Stop   02/20/23 1800  ampicillin-sulbactam (UNASYN) 1.5 g in sodium chloride 0.9 % 100 mL IVPB        1.5 g 200 mL/hr over 30 Minutes Intravenous Every 6 hours 02/20/23 1714     02/19/23 1500  ceFAZolin (ANCEF) IVPB 2g/100 mL premix        2 g 200 mL/hr over 30 Minutes Intravenous Every 6 hours 02/19/23 1109 02/20/23 0634   02/19/23 0807  ceFAZolin (ANCEF) 2-4 GM/100ML-% IVPB       Note to Pharmacy: [name] D: cabinet override      02/19/23 0807 02/19/23 2014   02/19/23 0600  ceFAZolin (ANCEF) IVPB 2g/100 mL premix        2 g 200 mL/hr over 30 Minutes  Intravenous On call to O.R. 02/18/23 2334 02/19/23 0926        DVT prophylaxis: Per orthopedics  Code Status: DNR  Family Communication: Discussed with daughter at bedside   CONSULTS    Subjective    Had aspiration event yesterday, requiring 100% 15 L nonrebreather.  Chest x-ray showed right lower lobe infiltrate.  Procalcitonin elevated 0.63.  Started on IV Unasyn, oxygen has been weaned off to room air this morning.  Feels better.  Objective    Physical Examination:  Appears in no acute distress Lungs clear to auscultation bilaterally Abdomen soft, nontender, no organomegaly Extremities no edema Alert, following commands  Status is: Inpatient:             Meredeth Ide   [facility] If 7PM-7AM, please contact night-coverage at [email or web address], Office  [phone]   02/21/2023, 9:28 AM  LOS: 3 days

## 2023-02-21 NOTE — [place] FL2 (Signed)
[place] MEDICAID [id number] LEVEL OF CARE FORM     IDENTIFICATION  Patient Name: Carla [name] Birthdate: 1965-11-25 Sex: female Admission Date (Current Location): 02/18/2023  [place] and [place] Number:  Haynes Bast [id number] Facility and Address:  [facility] [place], [address], [place], [place] [address]      Provider Number: [id number]  Attending Physician Name and Address:  Meredeth Ide, MD  Relative Name and Phone Number:  [name] Daughter [phone]    Current Level of Care: Hospital Recommended Level of Care: Skilled Nursing Facility Prior Approval Number:    Date Approved/Denied:   PASRR Number: [id number]  Discharge Plan: SNF    Current Diagnoses: Patient Active Problem List   Diagnosis Date Noted   Closed intertrochanteric fracture of left hip, initial encounter (HCC) 02/18/2023   Vascular dementia (HCC) 02/18/2023   Underweight 07/30/2022   COPD (chronic obstructive pulmonary disease) (HCC) 07/30/2022   Dysphagia 05/04/2022   Palliative care encounter 05/04/2022   Dementia (HCC) 01/10/2021    Orientation RESPIRATION BLADDER Height & Weight     Self  O2 Incontinent, External catheter Weight: 85 lb 1.6 oz (38.6 kg) Height:  5' 2.01" (157.5 cm)  BEHAVIORAL SYMPTOMS/MOOD NEUROLOGICAL BOWEL NUTRITION STATUS      Continent Diet (see discharge summary)  AMBULATORY STATUS COMMUNICATION OF NEEDS Skin   Total Care Verbally Surgical wounds                       Personal Care Assistance Level of Assistance  Bathing, Feeding, Dressing, Total care Bathing Assistance: Maximum assistance Feeding assistance: Limited assistance Dressing Assistance: Maximum assistance Total Care Assistance: Maximum assistance   Functional Limitations Info  Sight, Hearing, Speech Sight Info: Adequate Hearing Info: Adequate Speech Info: Adequate    SPECIAL CARE FACTORS FREQUENCY  PT (By licensed PT), OT (By licensed OT)     PT Frequency: 5x  week OT Frequency: 5x week            Contractures Contractures Info: Not present    Additional Factors Info  Code Status, Allergies Code Status Info: DNR Allergies Info: NKA           Current Medications (02/21/2023):  This is the current hospital active medication list Current Facility-Administered Medications  Medication Dose Route Frequency Provider Last Rate Last Admin   albuterol (PROVENTIL) (2.5 MG/3ML) 0.083% nebulizer solution 3 mL  3 mL Inhalation Q6H PRN [name], MD       ampicillin-sulbactam (UNASYN) 1.5 g in sodium chloride 0.9 % 100 mL IVPB  1.5 g Intravenous Q6H Meredeth Ide, MD 200 mL/hr at 02/21/23 0632 1.5 g at 02/21/23 8413   aspirin EC tablet 325 mg  325 mg Oral Q breakfast [name], MD   325 mg at 02/21/23 0932   docusate (COLACE) 50 MG/5ML liquid 100 mg  100 mg Oral Daily Meredeth Ide, MD       donepezil (ARICEPT) tablet 10 mg  10 mg Oral Daily [name], MD   10 mg at 02/21/23 0932   feeding supplement (ENSURE ENLIVE / ENSURE PLUS) liquid 237 mL  237 mL Oral BID BM [name], MD   237 mL at 02/20/23 1318   hydrALAZINE (APRESOLINE) tablet 25 mg  25 mg Oral Q6H PRN Meredeth Ide, MD       HYDROcodone-acetaminophen (NORCO/VICODIN) 5-325 MG per tablet 1-2 tablet  1-2 tablet Oral Q6H PRN [name], MD  menthol-cetylpyridinium (CEPACOL) lozenge 3 mg  1 lozenge Oral PRN [name], MD       Or   phenol [facility]) mouth spray 1 spray  1 spray Mouth/Throat PRN [name], MD       metoCLOPramide [facility]) tablet 5-10 mg  5-10 mg Oral Q8H PRN [name], MD       Or   metoCLOPramide (REGLAN) injection 5-10 mg  5-10 mg Intravenous Q8H PRN [name], MD       mometasone-formoterol [facility]) 200-5 MCG/ACT inhaler 2 puff  2 puff Inhalation BID [name], MD   2 puff at 02/21/23 4401   morphine (PF) 2 MG/ML injection 0.5 mg  0.5 mg Intravenous  Q3H PRN [name], MD       multivitamin with minerals tablet 1 tablet  1 tablet Oral Daily [name], MD   1 tablet at 02/21/23 0932   ondansetron [facility]) injection 4 mg  4 mg Intravenous Q6H PRN [name], MD       senna-docusate (Senokot-S) tablet 1 tablet  1 tablet Oral QHS PRN [name], MD       sertraline (ZOLOFT) tablet 75 mg  75 mg Oral Daily [name], MD   75 mg at 02/21/23 0932     Discharge Medications: Please see discharge summary for a list of discharge medications.  Relevant Imaging Results:  Relevant Lab Results:   Additional Information SSN: [id number]  Lorri Frederick, LCSW

## 2023-02-22 ENCOUNTER — Other Ambulatory Visit (HOSPITAL_BASED_OUTPATIENT_CLINIC_OR_DEPARTMENT_OTHER): Payer: Self-pay

## 2023-02-22 ENCOUNTER — Encounter (HOSPITAL_COMMUNITY): Payer: Self-pay | Admitting: Orthopedic Surgery

## 2023-02-22 ENCOUNTER — Ambulatory Visit: Payer: Medicaid Other | Admitting: Physician Assistant

## 2023-02-22 DIAGNOSIS — S72002A Fracture of unspecified part of neck of left femur, initial encounter for closed fracture: Secondary | ICD-10-CM | POA: Diagnosis not present

## 2023-02-22 DIAGNOSIS — S72142A Displaced intertrochanteric fracture of left femur, initial encounter for closed fracture: Secondary | ICD-10-CM | POA: Diagnosis not present

## 2023-02-22 DIAGNOSIS — J449 Chronic obstructive pulmonary disease, unspecified: Secondary | ICD-10-CM | POA: Diagnosis not present

## 2023-02-22 LAB — PROCALCITONIN: Procalcitonin: 0.37 ng/mL

## 2023-02-22 LAB — PHOSPHORUS: Phosphorus: 2.5 mg/dL (ref 2.5–4.6)

## 2023-02-22 LAB — MAGNESIUM: Magnesium: 1.7 mg/dL (ref 1.7–2.4)

## 2023-02-22 MED ORDER — MAGNESIUM SULFATE IN D5W 1-5 GM/100ML-% IV SOLN
1.0000 g | Freq: Once | INTRAVENOUS | Status: AC
  Administered 2023-02-22: 1 g via INTRAVENOUS
  Filled 2023-02-22: qty 100

## 2023-02-22 MED ORDER — SENNOSIDES-DOCUSATE SODIUM 8.6-50 MG PO TABS: 1.0000 | ORAL_TABLET | Freq: Every evening | ORAL | Status: AC | PRN

## 2023-02-22 MED ORDER — AMOXICILLIN-POT CLAVULANATE 875-125 MG PO TABS
1.0000 | ORAL_TABLET | Freq: Two times a day (BID) | ORAL | 0 refills | Status: AC
Start: 2023-02-22 — End: 2023-02-25
  Filled 2023-02-22: qty 6, 3d supply, fill #0

## 2023-02-22 MED ORDER — GUAIFENESIN 100 MG/5ML PO LIQD
10.0000 mL | Freq: Four times a day (QID) | ORAL | 0 refills | Status: DC | PRN
  Filled 2023-02-22: qty 120, 3d supply, fill #0
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
## 2023-02-22 NOTE — TOC Progression Note (Signed)
Transition of Care [facility]) - Progression Note    Patient Details  Name: Carla [name] MRN: [id number] Date of Birth: 1965/10/12  [facility] CM/SW Contact  Lorri Frederick, LCSW Phone Number: 02/22/2023, 12:42 PM  Clinical Narrative:   CSW spoke with pt daughter [name], no other bed offers.  She is agreeable to pt return to [facility].  CSW confirmed with [facility] that they can receive pt today.  CSW spoke with [name] about providing transport and she can do this.  She works until [employer], will be here afterwards to transport.      Expected Discharge Plan: Skilled Nursing Facility Barriers to Discharge: Continued Medical Work up, SNF Pending bed offer  Expected Discharge Plan and Services In-house Referral: Clinical Social Work   Post Acute Care Choice: Skilled Nursing Facility Living arrangements for the past 2 months: Skilled Nursing Facility [facility]) Expected Discharge Date: 02/22/23                                     Social Determinants of Health (SDOH) Interventions SDOH Screenings   Food Insecurity: Patient Unable To Answer (02/19/2023)  Housing: High Risk (02/19/2023)  Transportation Needs: Patient Unable To Answer (02/19/2023)  Utilities: Patient Unable To Answer (02/19/2023)  Tobacco Use: High Risk (02/19/2023)    Readmission Risk Interventions     No data to display

## 2023-02-22 NOTE — TOC Transition Note (Signed)
Transition of Care [facility]) - CM/SW Discharge Note   Patient Details  Name: Carla [name] MRN: [id number] Date of Birth: 1965-10-07  Transition of Care [facility]) CM/SW Contact:  Lorri Frederick, LCSW Phone Number: 02/22/2023, 12:45 PM   Clinical Narrative:   Pt discharging to [facility], room 406B.  RN call report to [phone].  Pt daughter Carla [name] will be here  between 2-3pm to transport.  Will need pt brought down to main north tower entrance with assistance getting into the vehicle.     Final next level of care: Skilled Nursing Facility Barriers to Discharge: Barriers Resolved   Patient Goals and CMS Choice   Choice offered to / list presented to : Adult Children (daughter Carla [name])  Discharge Placement                Patient chooses bed at: [facility] Patient to be transferred to facility by: daughter Carla [name] Name of family member notified: daughter Carla [name] Patient and family notified of of transfer: 02/22/23  Discharge Plan and Services Additional resources added to the After Visit Summary for   In-house Referral: Clinical Social Work   Post Acute Care Choice: Skilled Nursing Facility                               Social Determinants of Health (SDOH) Interventions SDOH Screenings   Food Insecurity: Patient Unable To Answer (02/19/2023)  Housing: High Risk (02/19/2023)  Transportation Needs: Patient Unable To Answer (02/19/2023)  Utilities: Patient Unable To Answer (02/19/2023)  Tobacco Use: High Risk (02/19/2023)     Readmission Risk Interventions     No data to display

## 2023-02-22 NOTE — Progress Notes (Signed)
Physical Therapy Treatment Patient Details Name: Carla [name] MRN: [id number] DOB: Aug 05, 1965 Today's Date: 02/22/2023   History of Present Illness 57 y.o. female admitted 02/18/23 after and unwitnessed fall at facility 5 days prior to admission.  Orthopedic follow up revealed L intertrochanteric hip fx, underwent IM nail on 02/19/23.  PMH: vascular dementia, CVA, dysarthria, COPD, asthma, DM2, HTN.    PT Comments  Pt tolerating therapy well.  Participated with several exercises and transfer to chair with multimodal cues.  Pain seems to be well controlled.  Requiring min-mod A of 1 for transfers. She was on RA with sats >95% throughout.  Patient will benefit from continued inpatient follow up therapy, <3 hours/day at d/c.     If plan is discharge home, recommend the following: A little help with walking and/or transfers;A little help with bathing/dressing/bathroom;Assist for transportation;Supervision due to cognitive status   Can travel by private vehicle     No  Equipment Recommendations  None recommended by PT    Recommendations for Other Services       Precautions / Restrictions Precautions Precautions: Fall Restrictions Weight Bearing Restrictions: Yes LLE Weight Bearing: Weight bearing as tolerated     Mobility  Bed Mobility Overal bed mobility: Needs Assistance Bed Mobility: Supine to Sit     Supine to sit: HOB elevated, Min assist     General bed mobility comments: assist for L LE, increased time    Transfers Overall transfer level: Needs assistance Equipment used: 1 person hand held assist, Rolling walker (2 wheels) Transfers: Sit to/from Stand, Bed to chair/wheelchair/BSC Sit to Stand: Mod assist Stand pivot transfers: Mod assist         General transfer comment: STS x 2 with stand pivot to recliner with RW; mod A to rise and pivot; pt not really using RW - would likely just do better with HHA    Ambulation/Gait               General Gait  Details: not able to take steps; pivots baseline   Stairs             Wheelchair Mobility     Tilt Bed    Modified Rankin (Stroke Patients Only)       Balance Overall balance assessment: Needs assistance Sitting-balance support: No upper extremity supported Sitting balance-Leahy Scale: Good     Standing balance support: Bilateral upper extremity supported Standing balance-Leahy Scale: Poor                              Cognition Arousal: Alert Behavior During Therapy: Flat affect Overall Cognitive Status: History of cognitive impairments - at baseline                                 General Comments: minimal communication, does shake head "Yes/no" , follows basic commands        Exercises General Exercises - Lower Extremity Ankle Circles/Pumps: AROM, Both, 20 reps, Seated Long Arc Quad: AROM, Right, AAROM, Left, Seated (10x2, cues for full ROM) Heel Slides: AROM, Right, AAROM, Left, Supine (2 x 5)    General Comments General comments (skin integrity, edema, etc.): Pt on RA with sats >95% throughout session      Pertinent Vitals/Pain Pain Assessment Pain Assessment: No/denies pain    Home Living  Prior Function            PT Goals (current goals can now be found in the care plan section) Progress towards PT goals: Progressing toward goals    Frequency    Min 1X/week      PT Plan      Co-evaluation              AM-PAC PT "6 Clicks" Mobility   Outcome Measure  Help needed turning from your back to your side while in a flat bed without using bedrails?: A Lot Help needed moving from lying on your back to sitting on the side of a flat bed without using bedrails?: A Lot Help needed moving to and from a bed to a chair (including a wheelchair)?: A Lot Help needed standing up from a chair using your arms (e.g., wheelchair or bedside chair)?: A Lot Help needed to walk in hospital  room?: Total Help needed climbing 3-5 steps with a railing? : Total 6 Click Score: 10    End of Session Equipment Utilized During Treatment: Gait belt   Patient left: in chair;with call bell/phone within reach;with chair alarm set (seat belt alarm) Nurse Communication: Mobility status PT Visit Diagnosis: Other abnormalities of gait and mobility (R26.89);History of falling (Z91.81)     Time: 8413-2440 PT Time Calculation (min) (ACUTE ONLY): 18 min  Charges:    $Therapeutic Activity: 8-22 mins PT General Charges $$ ACUTE PT VISIT: 1 Visit                     Anise Salvo, PT Acute Rehab [facility] [phone]    Rayetta Humphrey 02/22/2023, 11:19 AM
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
## 2023-03-04 ENCOUNTER — Other Ambulatory Visit (HOSPITAL_BASED_OUTPATIENT_CLINIC_OR_DEPARTMENT_OTHER): Payer: Self-pay

## 2023-03-14 ENCOUNTER — Ambulatory Visit (INDEPENDENT_AMBULATORY_CARE_PROVIDER_SITE_OTHER): Payer: Medicaid Other

## 2023-03-14 ENCOUNTER — Encounter: Payer: Self-pay | Admitting: Cardiovascular Disease

## 2023-03-14 ENCOUNTER — Ambulatory Visit: Payer: Medicaid Other | Attending: Cardiovascular Disease | Admitting: Cardiovascular Disease

## 2023-03-14 VITALS — BP 94/50 | HR 63 | Ht 62.0 in | Wt 72.0 lb

## 2023-03-14 DIAGNOSIS — I639 Cerebral infarction, unspecified: Secondary | ICD-10-CM | POA: Insufficient documentation

## 2023-03-14 DIAGNOSIS — R001 Bradycardia, unspecified: Secondary | ICD-10-CM

## 2023-03-14 NOTE — Progress Notes (Unsigned)
Enrolled for [employer] to mail a ZIO XT long term holter monitor to [id number] [place], [place]  [address].
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
## 2023-03-14 NOTE — Patient Instructions (Signed)
Medication Instructions:  No changes *If you need a refill on your cardiac medications before your next appointment, please call your pharmacy*  Testing/Procedures: Your physician has recommended that you wear a 7 DAY ZIO-PATCH monitor. The Zio patch cardiac monitor continuously records heart rhythm data for up to 14 days, this is for patients being evaluated for multiple types heart rhythms. For the first 24 hours post application, please avoid getting the Zio monitor wet in the shower or by excessive sweating during exercise. After that, feel free to carry on with regular activities. Keep soaps and lotions away from the ZIO XT Patch.  This will be mailed to you, please expect 7-10 days to receive.    Applying the monitor   Shave hair from upper left chest.   Hold abrader disc by orange tab.  Rub abrader in 40 strokes over left upper chest as indicated in your monitor instructions.   Clean area with 4 enclosed alcohol pads .  Use all pads to assure are is cleaned thoroughly.  Let dry.   Apply patch as indicated in monitor instructions.  Patch will be place under collarbone on left side of chest with arrow pointing upward.   Rub patch adhesive wings for 2 minutes.Remove white label marked "1".  Remove white label marked "2".  Rub patch adhesive wings for 2 additional minutes.   While looking in a mirror, press and release button in center of patch.  A small green light will flash 3-4 times .  This will be your only indicator the monitor has been turned on.     Do not shower for the first 24 hours.  You may shower after the first 24 hours.   Press button if you feel a symptom. You will hear a small click.  Record Date, Time and Symptom in the Patient Log Book.   When you are ready to remove patch, follow instructions on last 2 pages of Patient Log Book.  Stick patch monitor onto last page of Patient Log Book.   Place Patient Log Book in [place] box.  Use locking tab on box and tape box closed  securely.  The [employer] has [employer] on it.  Please place in mailbox as soon as possible.  Your physician should have your test results approximately 7 days after the monitor has been mailed back to [facility].   Call [facility] Customer Care at [phone] if you have questions regarding your ZIO XT patch monitor.  Call them immediately if you see an orange light blinking on your monitor.   If your monitor falls off in less than 4 days contact our Monitor department at [phone].  If your monitor becomes loose or falls off after 4 days call [employer] at [phone] for suggestions on securing your monitor    Follow-Up: At [facility], you and your health needs are our priority.  As part of our continuing mission to provide you with exceptional heart care, we have created designated Provider Care Teams.  These Care Teams include your primary Cardiologist (physician) and Advanced Practice Providers (APPs -  Physician Assistants and Nurse Practitioners) who all work together to provide you with the care you need, when you need it.  We recommend signing up for the patient portal called "MyChart".  Sign up information is provided on this After Visit Summary.  MyChart is used to connect with patients for Virtual Visits (Telemedicine).  Patients are able to view lab/test results, encounter notes, upcoming  appointments, etc.  Non-urgent messages can be sent to your provider as well.   To learn more about what you can do with MyChart, go to [email or web address].    Your next appointment:   Follow up as needed  With Dr Royann Shivers

## 2023-03-17 ENCOUNTER — Telehealth: Payer: Self-pay

## 2023-03-17 NOTE — Telephone Encounter (Signed)
Carla [name], a nursing home state investigator called regarding a complaint investigation for Carla [name]. The patient was a resident there and sustained a fall, and subsequently had to have surgery to repair her fractured left hip. Carla [name] had in her records that Dr. Steward Drone had seen the patient, so she needed to speak with him to ask some general questions about the situation. I advised Carla [name] that the patient saw Hazle Nordmann, PA at the [facility] location (02/18/23) and sent her to the ED at that site There is no record that Dr. Steward Drone saw the patient. She was then transferred to [facility]. Per the chart notes, Dr. Odis Hollingshead (the on-call doctor for unassigned patients) looked at the xrays and referred her to his partner, Dr. Duwayne Heck to perform the surgery on 02/19/23. They are both with [facility]. She has put in a call to Dr. Aundria Rud and is awaiting that return call. She also asked how to get the op note -- I advised her to call [facility] to get that from their medical records dept.

## 2023-03-19 ENCOUNTER — Emergency Department (HOSPITAL_BASED_OUTPATIENT_CLINIC_OR_DEPARTMENT_OTHER): Payer: Medicaid Other

## 2023-03-19 ENCOUNTER — Encounter (HOSPITAL_BASED_OUTPATIENT_CLINIC_OR_DEPARTMENT_OTHER): Payer: Self-pay

## 2023-03-19 ENCOUNTER — Emergency Department (HOSPITAL_BASED_OUTPATIENT_CLINIC_OR_DEPARTMENT_OTHER)
Admission: EM | Admit: 2023-03-19 | Discharge: 2023-03-19 | Disposition: A | Discharge status: Skilled Nursing Facility | Payer: Medicaid Other | Attending: Emergency Medicine | Admitting: Emergency Medicine

## 2023-03-19 ENCOUNTER — Other Ambulatory Visit: Payer: Self-pay

## 2023-03-19 DIAGNOSIS — W19XXXA Unspecified fall, initial encounter: Secondary | ICD-10-CM | POA: Diagnosis not present

## 2023-03-19 DIAGNOSIS — F0390 Unspecified dementia without behavioral disturbance: Secondary | ICD-10-CM | POA: Insufficient documentation

## 2023-03-19 DIAGNOSIS — Z7982 Long term (current) use of aspirin: Secondary | ICD-10-CM | POA: Diagnosis not present

## 2023-03-19 DIAGNOSIS — Z0489 Encounter for examination and observation for other specified reasons: Secondary | ICD-10-CM | POA: Insufficient documentation

## 2023-03-19 NOTE — Discharge Instructions (Signed)
You are seen in the emergency department today with concerns of a fall.  Your x-ray imaging and CT imaging were reassuring.  I suspect this fall is likely mild in nature.  If there continues to be recurrent falls or there is any evident pain in the future from an unknown fall, please return to the emergency department.  Otherwise I would recommend following up with primary care provider for further evaluation.

## 2023-03-19 NOTE — ED Notes (Signed)
Pt dc with  her daughter via POV. Dc papers given to pts daughter and states understanding

## 2023-03-19 NOTE — ED Provider Notes (Signed)
[place] EMERGENCY DEPARTMENT AT [facility] Provider Note   CSN: [id number] Arrival date & time: 03/19/23  1004     History Chief Complaint  Patient presents with   Fall    Carla [name] is a 57 y.o. female.  Patient past history significant for dementia, closed intertrochanteric fracture of the left hip here with concerns of a fall.  Patient's daughters brought patient in via EMS after concerns for an unwitnessed fall from her wheelchair earlier today.  Patient was reportedly put back in her wheelchair by staff and endorsing any specific area of pain.  Patient did have surgery due to a hip fracture on 02/19/2023 and has had no recurrent falls since today.  Patient denies any specific area of pain as far she can verbalize or describe.  Patient is not on blood thinners and unknown if patient hit her head on the ground or any other surface.   Fall       Home Medications Prior to Admission medications   Medication Sig Start Date End Date Taking? Authorizing Provider  acetaminophen (TYLENOL) 500 MG tablet Take 500 mg by mouth every 6 (six) hours as needed for fever or mild pain (pain score 1-3).    [provider]  albuterol (PROVENTIL HFA;VENTOLIN HFA) 108 (90 Base) MCG/ACT inhaler Inhale 2 puffs into the lungs every 6 (six) hours as needed for wheezing. 05/11/17   Rai, Delene Ruffini, MD  aspirin EC 81 MG tablet Take 1 tablet (81 mg total) by mouth in the morning and at bedtime. Swallow whole. 02/21/23 04/07/23  Dion Saucier D, PA  atorvastatin (LIPITOR) 40 MG tablet Take 40 mg by mouth every evening.    [provider]  Cholecalciferol (VITAMIN D3) 50 MCG (2000 UT) capsule Take 2,000 Units by mouth daily.    [provider]  donepezil (ARICEPT) 10 MG tablet Take 10 mg by mouth daily.    [provider]  guaiFENesin (ROBITUSSIN) 100 MG/5ML liquid Take 10 mLs by mouth every 6 (six) hours as needed for cough or to loosen phlegm. 02/22/23    Meredeth Ide, MD  memantine (NAMENDA) 10 MG tablet Take 10 mg by mouth 2 (two) times daily.    [provider]  mirtazapine (REMERON) 7.5 MG tablet Take 7.5 mg by mouth at bedtime.    [provider]  mometasone-formoterol (DULERA) 200-5 MCG/ACT AERO Inhale 2 puffs into the lungs 2 (two) times daily. 05/11/17   Rai, Ripudeep K, MD  senna-docusate (SENOKOT-S) 8.6-50 MG tablet Take 1 tablet by mouth at bedtime as needed for mild constipation. 02/22/23   Meredeth Ide, MD  sertraline (ZOLOFT) 50 MG tablet Take 75 mg by mouth daily. 03/16/21   [provider]  traMADol (ULTRAM) 50 MG tablet Take 1 tablet (50 mg total) by mouth every 8 (eight) hours as needed for moderate pain (pain score 4-6) or severe pain (pain score 7-10). 02/21/23   McClung, Fuller Plan D, PA  TRANSDERM-SCOP 1 MG/3DAYS Place 1 patch onto the skin every 3 (three) days.    [provider]      Allergies    Patient has no known allergies.    Review of Systems   Review of Systems  Musculoskeletal:        Fall hip pain  All other systems reviewed and are negative.   Physical Exam Updated Vital Signs BP 116/70 (BP Location: Right Arm)   Pulse (!) 57   Temp 98.2 F (36.8 C)  Resp 17   Ht 5\' 2"  (1.575 m)   Wt 32.7 kg   BMI 13.17 kg/m  Physical Exam Vitals and nursing note reviewed.  Constitutional:      General: She is not in acute distress.    Appearance: She is well-developed.  HENT:     Head: Normocephalic and atraumatic.  Eyes:     Conjunctiva/sclera: Conjunctivae normal.  Cardiovascular:     Rate and Rhythm: Normal rate and regular rhythm.     Heart sounds: No murmur heard. Pulmonary:     Effort: Pulmonary effort is normal. No respiratory distress.     Breath sounds: Normal breath sounds.  Abdominal:     Palpations: Abdomen is soft.     Tenderness: There is no abdominal tenderness.  Musculoskeletal:        General: No swelling, tenderness, deformity or signs of injury.  Normal range of motion.     Cervical back: Neck supple.     Comments: No palpable area of tenderness in the upper or lower extremities.  No tenderness in the pelvis, abdomen, or trunk.  No obvious bony deformity.  Skin:    General: Skin is warm and dry.     Capillary Refill: Capillary refill takes less than 2 seconds.  Neurological:     Mental Status: She is alert.  Psychiatric:        Mood and Affect: Mood normal.     ED Results / Procedures / Treatments   Labs (all labs ordered are listed, but only abnormal results are displayed) Labs Reviewed - No data to display  EKG None  Radiology DG Femur Min 2 Views Left Result Date: 03/19/2023 CLINICAL DATA:  Fall with leg pain. EXAM: LEFT FEMUR 2 VIEWS COMPARISON:  Hip radiographs dated 02/19/2023 FINDINGS: The patient is status post intramedullary nail and femoral neck screw on the left. Callus formation is seen around a chronic intertrochanteric fracture. No new acute fracture is identified. IMPRESSION: No new acute fracture. Electronically Signed   By: Romona Curls M.D.   On: 03/19/2023 11:48   DG Pelvis Portable Result Date: 03/19/2023 CLINICAL DATA:  Unwitnessed fall EXAM: PORTABLE PELVIS 1-2 VIEWS COMPARISON:  02/18/2023 FINDINGS: Status post intramedullary nail fixation of the left hip. Mildly displaced fracture fragment of the lesser trochanter of the left hip. No other evidence of displaced fracture in single rotated frontal view. Nonobstructive pattern of overlying bowel gas. IMPRESSION: Status post intramedullary nail fixation of the left hip. Mildly displaced fracture fragment of the lesser trochanter of the left hip. No other evidence of displaced fracture in single rotated frontal view. Electronically Signed   By: Jearld Lesch M.D.   On: 03/19/2023 11:11   CT Head Wo Contrast Result Date: 03/19/2023 CLINICAL DATA:  Head trauma, moderate to severe. EXAM: CT HEAD WITHOUT CONTRAST TECHNIQUE: Contiguous axial images were obtained  from the base of the skull through the vertex without intravenous contrast. RADIATION DOSE REDUCTION: This exam was performed according to the departmental dose-optimization program which includes automated exposure control, adjustment of the mA and/or kV according to patient size and/or use of iterative reconstruction technique. COMPARISON:  12/28/2021 FINDINGS: Brain: No evidence of acute infarction, hemorrhage, hydrocephalus, extra-axial collection or mass lesion/mass effect. Severe chronic small vessel disease with chronic lacunar infarcts that are numerous in the deep gray nuclei , deep white matter, and right paramedian pons. Small chronic left superior cerebellar infarct and confluent ischemic gliosis in the hemispheric white matter. Premature brain atrophy. No detected progression  from 2023. Vascular: No hyperdense vessel or unexpected calcification. Skull: Normal. Negative for fracture or focal lesion. Sinuses/Orbits: No acute finding. IMPRESSION: 1. No acute finding. 2. Severe chronic small vessel disease. Electronically Signed   By: Tiburcio Pea M.D.   On: 03/19/2023 10:49    Procedures Procedures   Medications Ordered in ED Medications - No data to display  ED Course/ Medical Decision Making/ A&P                                 Medical Decision Making Amount and/or Complexity of Data Reviewed Radiology: ordered.   This patient presents to the ED for concern of fall.  Differential diagnosis includes hip fracture, SAH, head strike, skin abrasion   Imaging Studies ordered:  I ordered imaging studies including x-ray pelvis, CT head, x-ray femur I independently visualized and interpreted imaging which showed x-ray of the pelvis shows some concern for possible mildly displaced lesser trochanteric fracture of the left hip but on x-ray of the femur no acute abnormality seen, CT head unremarkable I agree with the radiologist interpretation   Problem List / ED Course:  Patient with  past history significant for dementia, close intertrochanteric fracture of the left.  Concerns of a fall.  Patient's daughter reports that she had a mechanical fall when she fell out of her wheelchair earlier this morning.  Staff at [facility] nursing home placed patient back in the chair.  Patient not reporting any specific area of pain.  Given recent surgery, will image the pelvis for evaluation of any alteration to orthopedic equipment.  Patient's daughter is also requesting CT imaging of the head to rule out any intracranial injury.  Patient is on a blood thinner so doubt that this is necessary however daughter is requesting this imaging studies so we will order for assessment. X-ray imaging of the pelvis and femur are initially concerning for possible slightly displaced fracture of the lesser trochanter of the left hip but confirmed to be unremarkable on x-ray of the femur.  CT head unremarkable. Informed patient and daughter of reassuring findings.  I suspect the fall the patient experience was rather mild in nature but did advise to have her brought back in if she continues to have repeated falls.  No other acute or focal concerns at this time.  No evident pain.  Advised management with over-the-counter medications as tolerated such as Tylenol and ibuprofen.  Also encouraged patient to continue with any home medications that she may currently be prescribed.  No other acute indication for further evaluation or need for admission at this time.  Patient will be discharged back to facility.  Final Clinical Impression(s) / ED Diagnoses Final diagnoses:  Fall, initial encounter    Rx / DC Orders ED Discharge Orders     None         Salomon Mast 03/19/23 1204    Gwyneth Sprout, MD 03/20/23 [phone]

## 2023-03-19 NOTE — ED Triage Notes (Signed)
Brought in by [facility] from [facility] for unwitnessed fall from wheelchair. Was put back in wheelchair by staff. Daughter wanted eval. PMH 02/19/2023 hip surgery.

## 2023-03-19 NOTE — ED Notes (Signed)
Pts daughter in room, patient awaiting PTAR

## 2023-03-19 NOTE — ED Notes (Signed)
PTAR called per RN

## 2023-03-20 ENCOUNTER — Emergency Department (HOSPITAL_BASED_OUTPATIENT_CLINIC_OR_DEPARTMENT_OTHER): Payer: Medicaid Other

## 2023-03-20 ENCOUNTER — Encounter (HOSPITAL_BASED_OUTPATIENT_CLINIC_OR_DEPARTMENT_OTHER): Payer: Self-pay | Admitting: Emergency Medicine

## 2023-03-20 ENCOUNTER — Other Ambulatory Visit: Payer: Self-pay

## 2023-03-20 ENCOUNTER — Emergency Department (HOSPITAL_BASED_OUTPATIENT_CLINIC_OR_DEPARTMENT_OTHER)
Admission: EM | Admit: 2023-03-20 | Discharge: 2023-03-20 | Disposition: A | Discharge status: Home / Self Care | Payer: Medicaid Other | Attending: Emergency Medicine | Admitting: Emergency Medicine

## 2023-03-20 ENCOUNTER — Emergency Department (HOSPITAL_BASED_OUTPATIENT_CLINIC_OR_DEPARTMENT_OTHER): Payer: Medicaid Other | Admitting: Radiology

## 2023-03-20 DIAGNOSIS — F0390 Unspecified dementia without behavioral disturbance: Secondary | ICD-10-CM | POA: Diagnosis not present

## 2023-03-20 DIAGNOSIS — W19XXXA Unspecified fall, initial encounter: Secondary | ICD-10-CM | POA: Insufficient documentation

## 2023-03-20 DIAGNOSIS — J449 Chronic obstructive pulmonary disease, unspecified: Secondary | ICD-10-CM | POA: Diagnosis not present

## 2023-03-20 DIAGNOSIS — M25552 Pain in left hip: Secondary | ICD-10-CM | POA: Diagnosis present

## 2023-03-20 DIAGNOSIS — Z7982 Long term (current) use of aspirin: Secondary | ICD-10-CM | POA: Diagnosis not present

## 2023-03-20 NOTE — Discharge Instructions (Signed)
Carla [name] was seen in the emergency department after another fall The CAT scan of her head and neck looked okay Her chest x-ray and hip x-ray also looked okay There is no evidence of acute traumatic injury from tonight's fall It is important that she follows up with her primary care doctor within the next week for reevaluation Please continue to address your ongoing concerns with the nursing facility regarding repeated falls Return to the emergency department for repeated falls or any other concerns

## 2023-03-20 NOTE — ED Notes (Signed)
Initial contact made. Pt is resting in bed and verbalizes no needs at this time. Call bell is within reach

## 2023-03-20 NOTE — ED Notes (Signed)
PTAR has been called for transport. 

## 2023-03-20 NOTE — ED Provider Notes (Signed)
[place] EMERGENCY DEPARTMENT AT [facility] Provider Note   CSN: [id number] Arrival date & time: 03/20/23  1549     History  No chief complaint on file.   Carla [name] is a 57 y.o. female.  With a history of dementia, COPD and recent left intertrochanteric fracture status post ORIF November who presents to the ED after an unwitnessed fall.  Patient brought in by EMS from [facility] nursing facility after an unwitnessed fall.  She was found down on the floor next to her wheelchair.  Seen for the same presentation yesterday here and no acute injuries were found.  No loss of consciousness.  Patient herself has no reported pain at this time but did report pain in her left hip earlier to her daughter.  HPI     Home Medications Prior to Admission medications   Medication Sig Start Date End Date Taking? Authorizing Provider  acetaminophen (TYLENOL) 500 MG tablet Take 500 mg by mouth every 6 (six) hours as needed for fever or mild pain (pain score 1-3).    [provider]  albuterol (PROVENTIL HFA;VENTOLIN HFA) 108 (90 Base) MCG/ACT inhaler Inhale 2 puffs into the lungs every 6 (six) hours as needed for wheezing. 05/11/17   Rai, Delene Ruffini, MD  aspirin EC 81 MG tablet Take 1 tablet (81 mg total) by mouth in the morning and at bedtime. Swallow whole. 02/21/23 04/07/23  Dion Saucier D, PA  atorvastatin (LIPITOR) 40 MG tablet Take 40 mg by mouth every evening.    [provider]  Cholecalciferol (VITAMIN D3) 50 MCG (2000 UT) capsule Take 2,000 Units by mouth daily.    [provider]  donepezil (ARICEPT) 10 MG tablet Take 10 mg by mouth daily.    [provider]  guaiFENesin (ROBITUSSIN) 100 MG/5ML liquid Take 10 mLs by mouth every 6 (six) hours as needed for cough or to loosen phlegm. 02/22/23   Meredeth Ide, MD  memantine (NAMENDA) 10 MG tablet Take 10 mg by mouth 2 (two) times daily.    [provider]  mirtazapine (REMERON) 7.5 MG  tablet Take 7.5 mg by mouth at bedtime.    [provider]  mometasone-formoterol (DULERA) 200-5 MCG/ACT AERO Inhale 2 puffs into the lungs 2 (two) times daily. 05/11/17   Rai, Ripudeep K, MD  senna-docusate (SENOKOT-S) 8.6-50 MG tablet Take 1 tablet by mouth at bedtime as needed for mild constipation. 02/22/23   Meredeth Ide, MD  sertraline (ZOLOFT) 50 MG tablet Take 75 mg by mouth daily. 03/16/21   [provider]  traMADol (ULTRAM) 50 MG tablet Take 1 tablet (50 mg total) by mouth every 8 (eight) hours as needed for moderate pain (pain score 4-6) or severe pain (pain score 7-10). 02/21/23   McClung, Fuller Plan D, PA  TRANSDERM-SCOP 1 MG/3DAYS Place 1 patch onto the skin every 3 (three) days.    [provider]      Allergies    Patient has no known allergies.    Review of Systems   Review of Systems  Physical Exam Updated Vital Signs BP 104/71   Pulse 62   Temp 97.7 F (36.5 C)   Resp 16   SpO2 98%  Physical Exam Vitals and nursing note reviewed.  HENT:     Head: Normocephalic and atraumatic.  Eyes:     Pupils: Pupils are equal, round, and reactive to light.  Cardiovascular:     Rate and Rhythm: Normal rate and regular rhythm.  Pulmonary:     Effort: Pulmonary effort is normal.     Breath sounds: Normal breath sounds.  Abdominal:     Palpations: Abdomen is soft.     Tenderness: There is no abdominal tenderness.  Musculoskeletal:     Cervical back: Neck supple. No tenderness.     Comments: Mild tenderness over deep palpation over left anterior hip with no deformity or shortening Full active range of motion in all 4 extremities Sensation tact light touch throughout No sacral decubitus ulcer No back tenderness midline tenderness no step-off deformities  Skin:    General: Skin is warm and dry.  Neurological:     Mental Status: She is alert.  Psychiatric:        Mood and Affect: Mood normal.     ED Results / Procedures / Treatments   Labs (all  labs ordered are listed, but only abnormal results are displayed) Labs Reviewed - No data to display  EKG None  Radiology CT Cervical Spine Wo Contrast Result Date: 03/20/2023 CLINICAL DATA:  Neck trauma, midline tenderness (Age 72-64y) fall. EXAM: CT CERVICAL SPINE WITHOUT CONTRAST TECHNIQUE: Multidetector CT imaging of the cervical spine was performed without intravenous contrast. Multiplanar CT image reconstructions were also generated. RADIATION DOSE REDUCTION: This exam was performed according to the departmental dose-optimization program which includes automated exposure control, adjustment of the mA and/or kV according to patient size and/or use of iterative reconstruction technique. COMPARISON:  12/28/2021 FINDINGS: Alignment: Normal Skull base and vertebrae: No acute fracture. No primary bone lesion or focal pathologic process. Soft tissues and spinal canal: No prevertebral fluid or swelling. No visible canal hematoma. Disc levels: Diffuse degenerative disc disease with disc space narrowing and spurring. Mild bilateral degenerative facet disease. Upper chest: No acute findings Other: None IMPRESSION: No acute bony abnormality. Electronically Signed   By: Charlett Nose M.D.   On: 03/20/2023 20:47   CT Head Wo Contrast Result Date: 03/20/2023 CLINICAL DATA:  Head trauma, moderate-severe unwitnessed fall. EXAM: CT HEAD WITHOUT CONTRAST TECHNIQUE: Contiguous axial images were obtained from the base of the skull through the vertex without intravenous contrast. RADIATION DOSE REDUCTION: This exam was performed according to the departmental dose-optimization program which includes automated exposure control, adjustment of the mA and/or kV according to patient size and/or use of iterative reconstruction technique. COMPARISON:  03/19/2023 FINDINGS: Brain: There is atrophy and chronic small vessel disease changes. No acute intracranial abnormality. Specifically, no hemorrhage, hydrocephalus, mass lesion,  acute infarction, or significant intracranial injury. Vascular: No hyperdense vessel or unexpected calcification. Skull: No acute calvarial abnormality. Sinuses/Orbits: No acute findings Other: None IMPRESSION: Atrophy, chronic microvascular disease. No acute intracranial abnormality. Electronically Signed   By: Charlett Nose M.D.   On: 03/20/2023 20:46   DG Hip Unilat W or Wo Pelvis 2-3 Views Left Result Date: 03/20/2023 CLINICAL DATA:  Unwitnessed fall with pelvic/hip pain. EXAM: DG HIP (WITH OR WITHOUT PELVIS) 2-3V LEFT COMPARISON:  02/18/2023, 03/19/2023 FINDINGS: Diffuse decreased bone mineralization. Fixation hardware over the left proximal femur intact including intramedullary nail with associated compression screw bridging the femoral neck into the femoral head. Minimal residual lucency over patient's known subacute left trochanteric fracture. Right hip is normal. Remainder of the pelvis is unremarkable. IMPRESSION: 1. No acute findings. 2. Fixation hardware over the left proximal femur intact. Minimal residual lucency over patient's known subacute left trochanteric fracture. Electronically Signed   By: Elberta Fortis M.D.   On: 03/20/2023 20:46   DG Chest Portable 1 View  Result Date: 03/20/2023 CLINICAL DATA:  Unwitnessed fall. Caregiver found patient sitting on floor beside bed. EXAM: PORTABLE CHEST 1 VIEW COMPARISON:  02/20/2023 FINDINGS: Stable cardiomediastinal silhouette. Aortic atherosclerotic calcification. There are few residual interstitial opacities in the right lower lung. The previous right lower lung pneumonia seen on 02/20/2023 has otherwise resolved. Left basilar atelectasis. No pleural effusion or pneumothorax. No displaced rib fractures. IMPRESSION: 1. No displaced rib fractures. 2. Nearly resolved right lower lung pneumonia. Electronically Signed   By: Minerva Fester M.D.   On: 03/20/2023 20:43   DG Femur Min 2 Views Left Result Date: 03/19/2023 CLINICAL DATA:  Fall with leg  pain. EXAM: LEFT FEMUR 2 VIEWS COMPARISON:  Hip radiographs dated 02/19/2023 FINDINGS: The patient is status post intramedullary nail and femoral neck screw on the left. Callus formation is seen around a chronic intertrochanteric fracture. No new acute fracture is identified. IMPRESSION: No new acute fracture. Electronically Signed   By: Romona Curls M.D.   On: 03/19/2023 11:48   DG Pelvis Portable Result Date: 03/19/2023 CLINICAL DATA:  Unwitnessed fall EXAM: PORTABLE PELVIS 1-2 VIEWS COMPARISON:  02/18/2023 FINDINGS: Status post intramedullary nail fixation of the left hip. Mildly displaced fracture fragment of the lesser trochanter of the left hip. No other evidence of displaced fracture in single rotated frontal view. Nonobstructive pattern of overlying bowel gas. IMPRESSION: Status post intramedullary nail fixation of the left hip. Mildly displaced fracture fragment of the lesser trochanter of the left hip. No other evidence of displaced fracture in single rotated frontal view. Electronically Signed   By: Jearld Lesch M.D.   On: 03/19/2023 11:11   CT Head Wo Contrast Result Date: 03/19/2023 CLINICAL DATA:  Head trauma, moderate to severe. EXAM: CT HEAD WITHOUT CONTRAST TECHNIQUE: Contiguous axial images were obtained from the base of the skull through the vertex without intravenous contrast. RADIATION DOSE REDUCTION: This exam was performed according to the departmental dose-optimization program which includes automated exposure control, adjustment of the mA and/or kV according to patient size and/or use of iterative reconstruction technique. COMPARISON:  12/28/2021 FINDINGS: Brain: No evidence of acute infarction, hemorrhage, hydrocephalus, extra-axial collection or mass lesion/mass effect. Severe chronic small vessel disease with chronic lacunar infarcts that are numerous in the deep gray nuclei , deep white matter, and right paramedian pons. Small chronic left superior cerebellar infarct and  confluent ischemic gliosis in the hemispheric white matter. Premature brain atrophy. No detected progression from 2023. Vascular: No hyperdense vessel or unexpected calcification. Skull: Normal. Negative for fracture or focal lesion. Sinuses/Orbits: No acute finding. IMPRESSION: 1. No acute finding. 2. Severe chronic small vessel disease. Electronically Signed   By: Tiburcio Pea M.D.   On: 03/19/2023 10:49    Procedures Procedures    Medications Ordered in ED Medications - No data to display  ED Course/ Medical Decision Making/ A&P Clinical Course as of 03/20/23 2209  Sun Mar 20, 2023  2205 No acute traumatic findings on imaging.  Patient has remained stable and is appropriate for discharge back to skilled nursing facility.  Daughter has addressed multiple unwitnessed falls with this nursing facility and wants her to go back there tonight.  She has informed the state given her concern for her mother's care at the facility and there is an active investigation that is ongoing. [MP]    Clinical Course User Index [MP] Royanne Foots, DO  Medical Decision Making 57 year old female presenting from skilled nursing facility after another unwitnessed fall.  Unclear head strike.  No LOC.  Some left hip pain but this may be related to her recent ORIF status post intertrochanteric fracture.  No obvious deformity.  Patient resting comfortably.  Will obtain CT head and neck given unwitnessed fall as well as chest x-ray and pelvis x-ray to evaluate for potential traumatic injury.  Afebrile and hemodynamically stable here.  Resting comfortably on room air and drinking a smoothie provided by her daughter at bedside.  Amount and/or Complexity of Data Reviewed Radiology: ordered.           Final Clinical Impression(s) / ED Diagnoses Final diagnoses:  Fall, initial encounter  Pain of left hip    Rx / DC Orders ED Discharge Orders     None          Royanne Foots, DO 03/20/23 2209

## 2023-03-20 NOTE — ED Triage Notes (Signed)
Brought in for unwitnessed fall today. Brought by [facility] from [facility]. Pt awake and alert when staff found her.

## 2023-03-20 NOTE — ED Triage Notes (Signed)
Called Carla [name] spoke with caregiver Campbell Lerner and she states pt was found sitting on the floor beside her bed. No evidence that she had hit her head on anything and was awake and alert

## 2023-03-20 NOTE — ED Notes (Signed)
Skin discoloration at coccyx over bone noted.  Skin intact.  Pas padded dressing over site for protection

## 2023-03-20 NOTE — ED Notes (Signed)
Joetta Manners called and  nursing report given to [facility]

## 2023-03-24 DIAGNOSIS — R001 Bradycardia, unspecified: Secondary | ICD-10-CM | POA: Diagnosis not present

## 2023-06-01 ENCOUNTER — Ambulatory Visit (INDEPENDENT_AMBULATORY_CARE_PROVIDER_SITE_OTHER): Payer: Medicaid Other | Admitting: Neurology

## 2023-06-01 ENCOUNTER — Other Ambulatory Visit: Payer: Self-pay

## 2023-06-01 ENCOUNTER — Encounter: Payer: Self-pay | Admitting: Neurology

## 2023-06-01 ENCOUNTER — Other Ambulatory Visit (HOSPITAL_BASED_OUTPATIENT_CLINIC_OR_DEPARTMENT_OTHER): Payer: Self-pay

## 2023-06-01 ENCOUNTER — Ambulatory Visit: Payer: Medicaid Other | Admitting: Neurology

## 2023-06-01 VITALS — BP 140/78 | HR 59 | Ht 61.0 in | Wt 81.0 lb

## 2023-06-01 DIAGNOSIS — F09 Unspecified mental disorder due to known physiological condition: Secondary | ICD-10-CM | POA: Insufficient documentation

## 2023-06-01 DIAGNOSIS — I639 Cerebral infarction, unspecified: Secondary | ICD-10-CM | POA: Insufficient documentation

## 2023-06-01 DIAGNOSIS — G9349 Other encephalopathy: Secondary | ICD-10-CM | POA: Insufficient documentation

## 2023-06-01 DIAGNOSIS — R1313 Dysphagia, pharyngeal phase: Secondary | ICD-10-CM

## 2023-06-01 DIAGNOSIS — Z515 Encounter for palliative care: Secondary | ICD-10-CM

## 2023-06-01 MED ORDER — DONEPEZIL HCL 5 MG PO TABS
5.0000 mg | ORAL_TABLET | Freq: Every day | ORAL | 0 refills | Status: AC
  Filled 2023-06-01: qty 30, 30d supply, fill #0

## 2023-06-01 MED ORDER — MEMANTINE HCL 10 MG PO TABS
10.0000 mg | ORAL_TABLET | Freq: Two times a day (BID) | ORAL | 5 refills | Status: DC
  Filled 2023-06-01: qty 60, 30d supply, fill #0

## 2023-06-01 MED ORDER — DONEPEZIL HCL 10 MG PO TABS
10.0000 mg | ORAL_TABLET | Freq: Every day | ORAL | 5 refills | Status: DC
  Filled 2023-06-01: qty 30, 30d supply, fill #0

## 2023-06-01 MED ORDER — MEMANTINE HCL 28 X 5 MG & 21 X 10 MG PO TABS
ORAL_TABLET | ORAL | 12 refills | Status: AC
  Filled 2023-06-01: qty 49, 30d supply, fill #0
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
## 2023-06-01 NOTE — Patient Instructions (Signed)
 Since Carla [name] fell and broke her hip during her nursing home stay in November 2024 she had been taken home by her daughter and was given medication for 14 days only to cover.  This involves multiple medications including statin, Namenda, Aricept.  I explained that we cannot start Aricept or Namenda again at the high dose that she had reached before and I will therefore give a 71-month supply of Aricept at 5 mg daily and Namenda titration starter pack.  At this time the patient has a static encephalopathy, and this would not be followed by neurology but by physical medicine and rehabilitation and usually is also followed by a primary care physician.  Mrs. Cheeks daughter has made an effort to establish care with primary care and had so far chosen [facility] care on [address].  I would be happy to send my notes to the primary care physicians there.  Again I do not think that this is an embolic ongoing pattern of strokes but a static encephalopathy.

## 2023-06-02 ENCOUNTER — Other Ambulatory Visit (HOSPITAL_BASED_OUTPATIENT_CLINIC_OR_DEPARTMENT_OTHER): Payer: Self-pay

## 2023-09-20 ENCOUNTER — Other Ambulatory Visit (HOSPITAL_COMMUNITY): Payer: Self-pay | Admitting: Internal Medicine

## 2023-09-20 DIAGNOSIS — R1310 Dysphagia, unspecified: Secondary | ICD-10-CM

## 2023-10-07 ENCOUNTER — Ambulatory Visit (HOSPITAL_COMMUNITY)

## 2023-10-07 ENCOUNTER — Ambulatory Visit (HOSPITAL_COMMUNITY)
Admission: RE | Admit: 2023-10-07 | Discharge: 2023-10-07 | Disposition: A | Discharge status: Home / Self Care | Source: Ambulatory Visit | Attending: *Deleted | Admitting: *Deleted

## 2023-10-07 ENCOUNTER — Encounter (HOSPITAL_COMMUNITY): Payer: Self-pay

## 2023-10-25 ENCOUNTER — Ambulatory Visit (HOSPITAL_COMMUNITY)
Admission: RE | Admit: 2023-10-25 | Discharge: 2023-10-25 | Disposition: A | Discharge status: Home / Self Care | Source: Ambulatory Visit | Attending: Internal Medicine | Admitting: Internal Medicine

## 2023-10-25 ENCOUNTER — Ambulatory Visit (HOSPITAL_COMMUNITY)
Admission: RE | Admit: 2023-10-25 | Discharge: 2023-10-25 | Disposition: A | Discharge status: Home / Self Care | Source: Ambulatory Visit | Attending: *Deleted | Admitting: *Deleted

## 2023-10-25 DIAGNOSIS — I10 Essential (primary) hypertension: Secondary | ICD-10-CM | POA: Diagnosis not present

## 2023-10-25 DIAGNOSIS — F0150 Vascular dementia without behavioral disturbance: Secondary | ICD-10-CM | POA: Diagnosis not present

## 2023-10-25 DIAGNOSIS — J387 Other diseases of larynx: Secondary | ICD-10-CM | POA: Insufficient documentation

## 2023-10-25 DIAGNOSIS — R1310 Dysphagia, unspecified: Secondary | ICD-10-CM | POA: Diagnosis present

## 2023-10-25 DIAGNOSIS — E119 Type 2 diabetes mellitus without complications: Secondary | ICD-10-CM | POA: Insufficient documentation

## 2023-10-25 DIAGNOSIS — J4489 Other specified chronic obstructive pulmonary disease: Secondary | ICD-10-CM | POA: Insufficient documentation

## 2023-10-25 NOTE — Progress Notes (Signed)
 Modified Barium Swallow Study  Patient Details  Name: Carla [name] MRN: [id number] Date of Birth: 1965/08/22  Today's Date: 10/25/2023  Modified Barium Swallow completed.  Full report located under Chart Review in the Imaging Section.  History of Present Illness Carla [name] is a 58 yo female with PMH including vascular dementia, CVA, dysarthria, COPD, asthma, T2DM, HTN presenting for OP MBS. Admitted November 2024 with chest imaging showing R>L lung base airspace disease concerning for aspiration or PNA. Most recent MBS in chart is from March 2023 at which time pt had significant oral deficits that also resulted in aspiration before the swallow with thin liquids (PAS 8) and solids (PAS 7). Dys 1-2 with nectar thick liquids was recommended at that time; however, was also continued after FEES August 2024 was void of penetration/aspiration due to inconsistent supervision when pt was residing at [facility]. She has now returned home with her daughter, who provides full supervision for all meals, which primarily consist of pureed or soft solids  with nectar thick liquids. Of note, FEES revealed redundant tissue of the left arytenoid that will intermittently fall towards airway (contributes to wheezing breath sounds), left vocal fold appears immobile, vallecular cyst. Pt recently enrolled in [facility], who referred pt for OP MBS for reassessment to guide treatment.   Clinical Impression Pt exhibits improvements related to her oral phase but continues to demonstrate inconsistent timing resulting in silent aspiration of thin liquids (PAS 8). Although there is still trace anterior loss with liquid boluses, pt's oral containment and clearance overall appear significantly improved. She consistently initiates a swallow response after pooling in the pyriform sinuses but the majority of each bolus is swiftly cleared from the pharynx. Prior to administration of the barium tablet, only transient superficial penetration  had occurred. When the tablet was introduced, there were more notable oral deficits including posterior bolus transit and this disorganization is suspected to have contributed to mistiming resulting in silent aspiration which also recurred in a subsequent trial of thin liquids but not with nectar thick liquids. Discussed performance with pt and her daughter with recommendations to begin introducing some soft, bite-sized solids with close supervision. Otherwise, recommend primarily continuing nectar thick liquids with initiation of the [employer] Protocol (FFWP) between meals. Educational handouts were provided to pt's daughter and the importance of oral care was emphasized. Discussed the often variable nature of pt's dysphagia and the importance of close supervision to assess pt's need for softer solids if/when she grows fatigued. Feel that pt may benefit from ongoing SLP f/u on an OP basis.  The [employer] Protocol (FFWP) allows patients with dysphagia to drink plain water (neutral pH and free of bacteria) between meals, potentially improving hydration, oral mucosa, preservation of swallowing musculature, and quality of life, while maintaining safety through strict oral hygiene and swallowing guidelines. Patients must be assessed by a speech-language pathologist (SLP) to determine if they are appropriate for the protocol. Water is allowed between meals and 30 minutes after meals, but not during meals and only after thorough oral care.   Factors that may increase risk of adverse event in presence of aspiration Noe & Lianne 2021): Reduced cognitive function;Limited mobility;Weak cough  Swallow Evaluation Recommendations Recommendations: PO diet PO Diet Recommendation: Dysphagia 2 (Finely chopped);Mildly thick liquids (Level 2, nectar thick) Liquid Administration via: Cup;Straw Medication Administration: Crushed with puree Supervision: Full assist for feeding;Full supervision/cueing for  swallowing strategies Swallowing strategies  : Minimize environmental distractions;Slow rate;Small bites/sips;Check for anterior loss Postural changes: Position  pt fully upright for meals;Stay upright 30-60 min after meals Oral care recommendations: Oral care BID (2x/day)    Damien Blumenthal, M.A., CCC-SLP Speech Language Pathology, Acute Rehabilitation Services  Secure Chat preferred [phone]  10/25/2023,1:13 PM

## 2023-12-16 ENCOUNTER — Other Ambulatory Visit: Payer: Self-pay

## 2023-12-16 ENCOUNTER — Ambulatory Visit
Admission: RE | Admit: 2023-12-16 | Discharge: 2023-12-16 | Disposition: A | Discharge status: Home / Self Care | Source: Ambulatory Visit

## 2023-12-16 DIAGNOSIS — K117 Disturbances of salivary secretion: Secondary | ICD-10-CM

## 2023-12-16 DIAGNOSIS — R1311 Dysphagia, oral phase: Secondary | ICD-10-CM

## 2024-01-30 ENCOUNTER — Encounter: Payer: Self-pay | Admitting: Radiology
